# Patient Record
Sex: Male | Born: 1935 | Race: Black or African American | Hispanic: No | Marital: Single | Smoking: Former smoker
Health system: Southern US, Community
[De-identification: ages and names within clinical notes are randomized; demographics above are authoritative.]

## PROBLEM LIST (undated history)

## (undated) DIAGNOSIS — D649 Anemia, unspecified: Secondary | ICD-10-CM

## (undated) DIAGNOSIS — E669 Obesity, unspecified: Secondary | ICD-10-CM

## (undated) DIAGNOSIS — N183 Chronic kidney disease, stage 3 unspecified: Secondary | ICD-10-CM

## (undated) DIAGNOSIS — E785 Hyperlipidemia, unspecified: Secondary | ICD-10-CM

## (undated) DIAGNOSIS — H548 Legal blindness, as defined in USA: Secondary | ICD-10-CM

## (undated) DIAGNOSIS — R609 Edema, unspecified: Secondary | ICD-10-CM

## (undated) DIAGNOSIS — E1129 Type 2 diabetes mellitus with other diabetic kidney complication: Secondary | ICD-10-CM

## (undated) DIAGNOSIS — I1310 Hypertensive heart and chronic kidney disease without heart failure, with stage 1 through stage 4 chronic kidney disease, or unspecified chronic kidney disease: Secondary | ICD-10-CM

## (undated) DIAGNOSIS — D075 Carcinoma in situ of prostate: Secondary | ICD-10-CM

## (undated) HISTORY — PX: COLONOSCOPY: SHX174

---

## 2001-12-24 ENCOUNTER — Encounter (INDEPENDENT_AMBULATORY_CARE_PROVIDER_SITE_OTHER): Payer: Self-pay | Admitting: Specialist

## 2001-12-24 ENCOUNTER — Observation Stay (HOSPITAL_COMMUNITY): Admission: AD | Admit: 2001-12-24 | Discharge: 2001-12-25 | Payer: Self-pay | Admitting: Gastroenterology

## 2002-03-05 ENCOUNTER — Inpatient Hospital Stay (HOSPITAL_COMMUNITY): Admission: EM | Admit: 2002-03-05 | Discharge: 2002-03-07 | Payer: Self-pay | Admitting: Emergency Medicine

## 2007-05-14 ENCOUNTER — Emergency Department (HOSPITAL_COMMUNITY): Admission: EM | Admit: 2007-05-14 | Discharge: 2007-05-14 | Payer: Self-pay | Admitting: *Deleted

## 2007-06-14 ENCOUNTER — Emergency Department (HOSPITAL_COMMUNITY): Admission: EM | Admit: 2007-06-14 | Discharge: 2007-06-14 | Payer: Self-pay | Admitting: Emergency Medicine

## 2007-06-16 ENCOUNTER — Emergency Department (HOSPITAL_COMMUNITY): Admission: EM | Admit: 2007-06-16 | Discharge: 2007-06-16 | Payer: Self-pay | Admitting: Emergency Medicine

## 2008-05-28 ENCOUNTER — Emergency Department (HOSPITAL_COMMUNITY): Admission: EM | Admit: 2008-05-28 | Discharge: 2008-05-28 | Payer: Self-pay | Admitting: Emergency Medicine

## 2008-10-10 ENCOUNTER — Emergency Department (HOSPITAL_COMMUNITY): Admission: EM | Admit: 2008-10-10 | Discharge: 2008-10-10 | Payer: Self-pay | Admitting: Emergency Medicine

## 2010-01-09 ENCOUNTER — Emergency Department (HOSPITAL_COMMUNITY)
Admission: EM | Admit: 2010-01-09 | Discharge: 2010-01-09 | Payer: Self-pay | Source: Home / Self Care | Admitting: Emergency Medicine

## 2010-09-28 ENCOUNTER — Ambulatory Visit (HOSPITAL_COMMUNITY)
Admission: RE | Admit: 2010-09-28 | Discharge: 2010-09-28 | Payer: Self-pay | Source: Home / Self Care | Attending: Urology | Admitting: Urology

## 2011-01-07 LAB — DIFFERENTIAL
Basophils Absolute: 0.1 10*3/uL (ref 0.0–0.1)
Basophils Relative: 2 % — ABNORMAL HIGH (ref 0–1)
Eosinophils Absolute: 0.6 10*3/uL (ref 0.0–0.7)
Lymphocytes Relative: 16 % (ref 12–46)
Lymphs Abs: 0.9 10*3/uL (ref 0.7–4.0)
Monocytes Absolute: 0.5 10*3/uL (ref 0.1–1.0)
Neutro Abs: 3.3 10*3/uL (ref 1.7–7.7)
Neutrophils Relative %: 61 % (ref 43–77)

## 2011-01-07 LAB — BASIC METABOLIC PANEL
BUN: 36 mg/dL — ABNORMAL HIGH (ref 6–23)
CO2: 25 mEq/L (ref 19–32)
Chloride: 106 mEq/L (ref 96–112)
Creatinine, Ser: 2.34 mg/dL — ABNORMAL HIGH (ref 0.4–1.5)

## 2011-01-07 LAB — CBC
HCT: 31.2 % — ABNORMAL LOW (ref 39.0–52.0)
Hemoglobin: 10.6 g/dL — ABNORMAL LOW (ref 13.0–17.0)
Platelets: 161 10*3/uL (ref 150–400)
RBC: 3.73 MIL/uL — ABNORMAL LOW (ref 4.22–5.81)

## 2011-01-07 LAB — URINE MICROSCOPIC-ADD ON

## 2011-01-07 LAB — URINALYSIS, ROUTINE W REFLEX MICROSCOPIC
Hgb urine dipstick: NEGATIVE
Protein, ur: NEGATIVE mg/dL

## 2011-01-07 LAB — GLUCOSE, CAPILLARY

## 2011-01-16 ENCOUNTER — Emergency Department (HOSPITAL_BASED_OUTPATIENT_CLINIC_OR_DEPARTMENT_OTHER)
Admission: EM | Admit: 2011-01-16 | Discharge: 2011-01-16 | Disposition: A | Discharge status: Home / Self Care | Payer: Medicare Other | Attending: Emergency Medicine | Admitting: Emergency Medicine

## 2011-01-16 DIAGNOSIS — E162 Hypoglycemia, unspecified: Secondary | ICD-10-CM | POA: Insufficient documentation

## 2011-01-16 DIAGNOSIS — E785 Hyperlipidemia, unspecified: Secondary | ICD-10-CM | POA: Insufficient documentation

## 2011-01-16 DIAGNOSIS — Z79899 Other long term (current) drug therapy: Secondary | ICD-10-CM | POA: Insufficient documentation

## 2011-01-16 DIAGNOSIS — N390 Urinary tract infection, site not specified: Secondary | ICD-10-CM | POA: Insufficient documentation

## 2011-01-16 DIAGNOSIS — I10 Essential (primary) hypertension: Secondary | ICD-10-CM | POA: Insufficient documentation

## 2011-01-16 LAB — URINALYSIS, ROUTINE W REFLEX MICROSCOPIC
Glucose, UA: NEGATIVE mg/dL
Ketones, ur: NEGATIVE mg/dL
Nitrite: NEGATIVE
Protein, ur: NEGATIVE mg/dL
Specific Gravity, Urine: 1.017 (ref 1.005–1.030)
pH: 5 (ref 5.0–8.0)

## 2011-01-16 LAB — URINE MICROSCOPIC-ADD ON

## 2011-01-16 LAB — GLUCOSE, CAPILLARY: Glucose-Capillary: 124 mg/dL — ABNORMAL HIGH (ref 70–99)

## 2011-01-17 LAB — URINE CULTURE
Colony Count: >=100000
Culture  Setup Time: 201204030556

## 2011-03-02 NOTE — Procedures (Signed)
[facility]  Patient:    Luke Johnson, Luke Johnson Visit Number: [id number] MRN: [id number]          Service Type: MED Location: 3W [id number] 01 Attending Physician:  Nelda Marseille Dictated by:   Petra Kuba, M.D. Proc. Date: 12/25/01 Admit Date:  12/24/2001 Discharge Date: 12/25/2001   CC:         Reather Littler, M.D.   Procedure Report  PROCEDURE:  Colonoscopy with polypectomy.  INDICATIONS:  Patient with guaiac-positivity and anemia in a patient due for colonic screening.  Benefits methods and options thoroughly discussed in the office.  MEDICATIONS:  Demerol 40, Versed 4.  DESCRIPTION OF PROCEDURE:  Rectal inspection is pertinent for external hemorrhoids, small.  Digital exam was negative.  The video pediatric adjustable colonoscope was inserted and fairly easily advanced around the colon to the cecum.  This did require some abdominal pressure and rolling him on his back.  On insertion, no significant abnormality was seen.  The cecum was identified by the appendiceal orifice and the ileocecal valve.  In fact, the scope was inserted a short ways into the terminal ileum which was normal. Photodocumentation was obtained.  The scope was slowly withdrawn.  The prep was adequate.  There was some liquid stool that required washing and suctioning.  On slow withdrawal through the colon in the proximal ascending, a 2 mm polyp was seen and was cold biopsied x 1.  In the hepatic flexure a 3-4 polyp was seen, snared, electrocautery applied, and the polyp was suctioned through the scope and collected in the trap. That was put in with the other ascending polyp mentioned above.  The scope was slowly withdrawn.  In the mid sigmoid, a 2 mm polyp was seen and was hot biopsied x 2, and a second questionable tiny polyp in the distal sigmoid was seen and cold biopsied x 1 and put in the second container.  Once back in the rectum, some very mild distal radiation proctitis  was seen.  The scope was retroflexed revealing some tiny internal hemorrhoids.  The scope was straightened, air was suctioned, and the scope removed.  The patient tolerated the procedure well.  There was no obvious immediate complication.  ENDOSCOPIC DIAGNOSES: 1. Internal and external hemorrhoids. 2. Minimal distal radiation proctitis. 3. Tiny sigmoid questionable polyp, cold biopsied. 4. Small mid sigmoid polyp, hot biopsied x 2. 5. Small hepatic flexure polyp snared. 6. Tiny ascending polyp cold biopsied. 7. Otherwise within normal limits to the terminal ileum.  PLAN:  Await pathology to determine future colonic screening.  Happy to use the argon Erbe plasma coagulator p.r.n. increased bleeding.  In the meantime, we will try Canasa suppositories and see back in the office in 203 months to recheck symptoms, guaiacs, CBC, and make no further work-up plans are needed. Dictated by:   Petra Kuba, M.D. Attending Physician:  Nelda Marseille DD:  12/25/01 TD:  12/27/01 Job: [id number]

## 2011-03-02 NOTE — H&P (Signed)
[place]. [facility]  Patient:    Luke Johnson, Luke Johnson Visit Number: [id number] MRN: [id number]          Service Type: MED Location: MICU 2101 01 Attending Physician:  Altheimer, [place] Dictated by:   Veverly Fells. Altheimer, M.D. Admit Date:  03/05/2002   CC:         Reather Littler, M.D.  [facility]   History and Physical  REASON FOR ADMISSION:  Severe refractory hypoglycemia.  HISTORY:  This is a 77 year old black male with type 2 diabetes diagnosed in about 77.  He previously was on insulin but that was discontinued in 1998 due to hypoglycemia.  He has been in fair control on Glucovance 2.5/500 two tablets b.i.d.  He is a resident of [facility].  He denies any prior severe hypoglycemic episodes on Glucovance but it is unclear how accurate his history may be at the present time.  Hemoglobin A1c was 7.1% earlier this month.  He reports some nausea and vomiting, perhaps yesterday, which has resolved. He states that he was eating normally today but again, the history is unclear. Today he was noted at the nursing home to have altered mental status and was found to have severe hypoglycemia and was brought to the emergency room by EMS.  He was combative and confused and disoriented on arrival in the emergency room with a blood sugar of 31.  He was also diaphoretic.  He was treated with one amp of D-50 and subsequently was able to eat some, and his symptoms resolved and his CBG went up as high as 159.  However, his CBG fell back down to about 44 with much milder symptoms about four hours into his observation in the emergency room.  He was given two additional amps of D-50 and his CBG is currently 156.  He is alert and oriented and denies current complaints.  He has eaten additional small amounts in the emergency room.  PAST MEDICAL HISTORY: 1. Hypertension. 2. Dislipidemia. 3. 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 4. Prostate cancer  in 1995 for which he underwent a seed implant. 5. Right ureteral lithotomy in 1972.  ALLERGIES:  No known drug allergies.  MEDICATIONS: 1. Glucovance as above. 2. Lotrel 5/20 q.d. 3. Lozol 2.5 mg q.d. 4. Pravachol 40 mg q.d.  FAMILY HISTORY:  Positive for diabetes.  Otherwise noncontributory.  SOCIAL HISTORY:  He is a resident of [facility] [place].  He is a former Visual merchandiser.  He does not smoke or consume alcohol.  REVIEW OF SYSTEMS:  Denies headaches.  Severe visual loss as above.  Poor dentition.  CARDIOPULMONARY:  Denies dyspnea, wheezing, cough, chest pain, or palpitations.  GASTROINTESTINAL:  Episode of nausea and vomiting as above. Otherwise negative.  Denies abdominal pain, heartburn, stool changes. GENITOURINARY:  Denies dysuria or voiding symptoms.  MUSCULOSKELETAL: Negative.  NEUROLOGIC:  Denies weakness, numbness, or gait disorder.  PHYSICAL EXAMINATION:  GENERAL:  He is currently alert, pleasant, cooperative, and oriented x3 except that he needed some prompting to come up with the year.  VITAL SIGNS:  Stable and oxygen saturation 99%.  SKIN:  Negative.  HEENT:  Eyes:  He is nearly totally blind in both eyes.  Corneal opacities. Oropharynx:  Poor dentition.  NECK:  Supple without thyromegaly, with carotid upstrokes normal without bruit.  LUNGS:  Unlabored, clear.  HEART:  Regular without murmur.  ABDOMEN:  Soft, nontender, no mass.  EXTREMITIES:  Pedal pulses intact without edema.  NEUROLOGIC:  Intact without any focal deficit.  LABORATORY DATA:  No other laboratory data is yet available.  ASSESSMENT: 1. Severe hypoglycemia secondary to Glucovance.  He is at risk for recurrent    hypoglycemia due to the long duration of effect of glyburide. 2. Diabetes mellitus type 2. 3. Blindness both eyes secondary to glaucoma. 4. Dislipidemia. 5. Hypertension.  PLAN:  Will treat with D-5-half normal saline and encourage him to eat. Hourly CBGs for now.  Will stop  the Glucovance and probably change him to plain Glucophage plus low-dose Glucotrol XL to reduce the risk of recurrent episodes in the future. Dictated by:   Veverly Fells. Altheimer, M.D. Attending Physician:  Brennan Bailey DD:  03/05/02 TD:  03/07/02 Job: [id number]

## 2011-03-02 NOTE — Discharge Summary (Signed)
[place]. [facility]  Patient:    Luke Johnson, Luke Johnson Visit Number: [id number] MRN: [id number]          Service Type: MED Location: 5000 [id number] Attending Physician:  Altheimer, [place] Dictated by:   Corrin Parker, M.D. Admit Date:  03/05/2002 Disc. Date: 03/07/02                             Discharge Summary  HISTORY OF PRESENT ILLNESS: The patient is an elderly man who presented to the hospital with a history of hypoglycemia. He has a history of type II diabetes which has been long standing and he has been receiving Glucovance for this. He has a history of hypertension for which he has been on several medications with excellent control and also a history of dyslipidemia for which he has been taking Pravachol. He has a history of prostate cancer which was treated with radiotherapy in the past and he has a history of being blind from glaucoma. He has moderate mild peripheral neuropathy and a history of previous bradycardia. He has a mild hypochromic anemia for which he has taken iron in the past.  HOSPITAL COURSE: He presented to the ER with combative behavior associated with severe hypoglycemia. He was treated with glucose and now is essentially back to his baseline. His glucoses have increased recently. He has been discharged to be further followed up in the nursing home.  DISCHARGE IMPRESSION: 1. Hypoglycemia, secondary to Glucovance. 2. History of hypertension. 3. Dyslipidemia. 4. History of prostate cancer. 5. History of anemia.  DISCHARGE MEDICATIONS: 1. Norvasc 5 mg q.d. 2. Lotensin 20 mg q.d. 3. Lozol 2.5 mg q.d. 4. Pravachol 40 mg each p.m. 5. Starlix 120 mg at mealtime. 6. Actos 30 mg one q.d.  SPECIAL INSTRUCTIONS: His glucoses are to be checked b.i.d. before breakfast and before supper.  DIET: Diabetic diet.  ACTIVITY: As tolerated.  FOLLOW-UP: He should be seen by Dr. ___ in a period of two to three weeks.  DISCHARGE  CONDITION: Improved. Dictated by:   Corrin Parker, M.D. Attending Physician:  Brennan Bailey DD:  03/07/02 TD:  03/07/02 Job: [id number]

## 2011-07-13 LAB — POCT I-STAT, CHEM 8
BUN: 35 — ABNORMAL HIGH
Creatinine, Ser: 1.9 — ABNORMAL HIGH
HCT: 31 — ABNORMAL LOW
Potassium: 3.8
Sodium: 138
TCO2: 23

## 2011-07-13 LAB — URINE MICROSCOPIC-ADD ON

## 2011-07-13 LAB — DIFFERENTIAL
Basophils Absolute: 0
Lymphs Abs: 1.1
Monocytes Relative: 14 — ABNORMAL HIGH

## 2011-07-13 LAB — POCT CARDIAC MARKERS
CKMB, poc: 2.1
Troponin i, poc: <0.05

## 2011-07-13 LAB — URINALYSIS, ROUTINE W REFLEX MICROSCOPIC
Bilirubin Urine: NEGATIVE
Hgb urine dipstick: NEGATIVE
Urobilinogen, UA: 1

## 2011-07-13 LAB — CBC
HCT: 30.9 — ABNORMAL LOW
Hemoglobin: 10.2 — ABNORMAL LOW
MCHC: 33.1
MCV: 81.4
RBC: 3.79 — ABNORMAL LOW
RDW: 13.6
WBC: 4.1

## 2011-07-13 LAB — URINE CULTURE: Colony Count: >=100000

## 2011-07-20 LAB — URINALYSIS, ROUTINE W REFLEX MICROSCOPIC
Hgb urine dipstick: NEGATIVE
Nitrite: NEGATIVE
Specific Gravity, Urine: 1.007 (ref 1.005–1.030)
Urobilinogen, UA: 1 mg/dL (ref 0.0–1.0)

## 2011-07-20 LAB — POCT I-STAT, CHEM 8
Calcium, Ion: 1.14 mmol/L (ref 1.12–1.32)
Chloride: 96 mEq/L (ref 96–112)
HCT: 34 % — ABNORMAL LOW (ref 39.0–52.0)
Hemoglobin: 11.6 g/dL — ABNORMAL LOW (ref 13.0–17.0)
TCO2: 25 mmol/L (ref 0–100)

## 2011-07-20 LAB — URINE MICROSCOPIC-ADD ON

## 2011-07-20 LAB — GLUCOSE, CAPILLARY
Glucose-Capillary: 60 mg/dL — ABNORMAL LOW (ref 70–99)
Glucose-Capillary: 61 mg/dL — ABNORMAL LOW (ref 70–99)

## 2011-07-27 LAB — I-STAT 8, (EC8 V) (CONVERTED LAB)
Bicarbonate: 26.3 — ABNORMAL HIGH
Glucose, Bld: 118 — ABNORMAL HIGH
HCT: 38 — ABNORMAL LOW
Hemoglobin: 12.9 — ABNORMAL LOW
Operator id: 284141
Sodium: 131 — ABNORMAL LOW
TCO2: 28
pCO2, Ven: 44.9 — ABNORMAL LOW

## 2011-07-27 LAB — URINALYSIS, ROUTINE W REFLEX MICROSCOPIC
Glucose, UA: NEGATIVE
Specific Gravity, Urine: 1.011
pH: 6

## 2011-07-27 LAB — CBC
Hemoglobin: 11.3 — ABNORMAL LOW
MCHC: 33.9
MCV: 80.1
RDW: 13.6

## 2011-07-27 LAB — DIFFERENTIAL
Basophils Absolute: 0
Basophils Relative: 0
Eosinophils Absolute: 0.1
Monocytes Absolute: 0.5
Neutro Abs: 3.9

## 2011-07-27 LAB — POCT I-STAT CREATININE: Operator id: 284141

## 2011-07-30 LAB — I-STAT 8, (EC8 V) (CONVERTED LAB)
Acid-Base Excess: 2
Bicarbonate: 28.3 — ABNORMAL HIGH
HCT: 40
Operator id: 279831
TCO2: 30
pCO2, Ven: 52.4 — ABNORMAL HIGH
pH, Ven: 7.341 — ABNORMAL HIGH

## 2011-07-30 LAB — URINE CULTURE: Colony Count: >=100000

## 2011-07-30 LAB — URINALYSIS, ROUTINE W REFLEX MICROSCOPIC
Bilirubin Urine: NEGATIVE
Hgb urine dipstick: NEGATIVE
Ketones, ur: NEGATIVE
Nitrite: NEGATIVE
Protein, ur: NEGATIVE
Specific Gravity, Urine: 1.011
Urobilinogen, UA: 0.2

## 2011-07-30 LAB — URINE MICROSCOPIC-ADD ON

## 2012-03-19 ENCOUNTER — Other Ambulatory Visit: Payer: Self-pay | Admitting: Gastroenterology

## 2012-06-08 ENCOUNTER — Emergency Department (HOSPITAL_COMMUNITY)
Admission: EM | Admit: 2012-06-08 | Discharge: 2012-06-09 | Disposition: A | Discharge status: Skilled Nursing Facility | Payer: Medicare Other | Attending: Emergency Medicine | Admitting: Emergency Medicine

## 2012-06-08 ENCOUNTER — Emergency Department (HOSPITAL_COMMUNITY): Payer: Medicare Other

## 2012-06-08 ENCOUNTER — Encounter (HOSPITAL_COMMUNITY): Payer: Self-pay | Admitting: Emergency Medicine

## 2012-06-08 DIAGNOSIS — E669 Obesity, unspecified: Secondary | ICD-10-CM | POA: Insufficient documentation

## 2012-06-08 DIAGNOSIS — Z794 Long term (current) use of insulin: Secondary | ICD-10-CM | POA: Insufficient documentation

## 2012-06-08 DIAGNOSIS — R142 Eructation: Secondary | ICD-10-CM | POA: Insufficient documentation

## 2012-06-08 DIAGNOSIS — Z7982 Long term (current) use of aspirin: Secondary | ICD-10-CM | POA: Insufficient documentation

## 2012-06-08 DIAGNOSIS — R140 Abdominal distension (gaseous): Secondary | ICD-10-CM

## 2012-06-08 DIAGNOSIS — N183 Chronic kidney disease, stage 3 unspecified: Secondary | ICD-10-CM | POA: Insufficient documentation

## 2012-06-08 DIAGNOSIS — Z79899 Other long term (current) drug therapy: Secondary | ICD-10-CM | POA: Insufficient documentation

## 2012-06-08 DIAGNOSIS — R143 Flatulence: Secondary | ICD-10-CM | POA: Insufficient documentation

## 2012-06-08 DIAGNOSIS — E119 Type 2 diabetes mellitus without complications: Secondary | ICD-10-CM | POA: Insufficient documentation

## 2012-06-08 DIAGNOSIS — R141 Gas pain: Secondary | ICD-10-CM | POA: Insufficient documentation

## 2012-06-08 HISTORY — DX: Legal blindness, as defined in USA: H54.8

## 2012-06-08 HISTORY — DX: Hyperlipidemia, unspecified: E78.5

## 2012-06-08 HISTORY — DX: Chronic kidney disease, stage 3 (moderate): N18.3

## 2012-06-08 HISTORY — DX: Chronic kidney disease, stage 3 unspecified: N18.30

## 2012-06-08 HISTORY — DX: Type 2 diabetes mellitus with other diabetic kidney complication: E11.29

## 2012-06-08 HISTORY — DX: Obesity, unspecified: E66.9

## 2012-06-08 HISTORY — DX: Anemia, unspecified: D64.9

## 2012-06-08 HISTORY — DX: Carcinoma in situ of prostate: D07.5

## 2012-06-08 HISTORY — DX: Hypertensive heart and chronic kidney disease without heart failure, with stage 1 through stage 4 chronic kidney disease, or unspecified chronic kidney disease: I13.10

## 2012-06-08 HISTORY — DX: Edema, unspecified: R60.9

## 2012-06-08 LAB — URINALYSIS, ROUTINE W REFLEX MICROSCOPIC
Bilirubin Urine: NEGATIVE
Glucose, UA: NEGATIVE mg/dL
Ketones, ur: NEGATIVE mg/dL
Protein, ur: NEGATIVE mg/dL
pH: 6 (ref 5.0–8.0)

## 2012-06-08 NOTE — ED Notes (Signed)
As per EMS pt was given laxative  Today, pt also has a BM today. Still c/o difficulty moving bowels.VSS.pwd

## 2012-06-08 NOTE — ED Notes (Signed)
Pt presents with no acute distress.  Constipated x 1 week- denies abdominal pain N/V/and fever/ Has small bm today yet still feels full

## 2012-06-17 NOTE — ED Provider Notes (Signed)
History     CSN: [id number]  Arrival date & time 06/08/12  2001   First MD Initiated Contact with Patient 06/08/12 2103      Chief Complaint  Patient presents with  . Constipation    (Consider location/radiation/quality/duration/timing/severity/associated sxs/prior treatment) HPI Luke Johnson is a 78 y.o. male presenting from ECF with a chief complaint of bloating and constipation. He had one BM today but was small.  Says his prior BM was a week ago.  Denies abdominal pain, nausea or vomiting.  Denies chest pain, dizziness, shortness of breath.  No other complaints.  His symptoms have been persistent for a week, gradually worsening bloating and are considered moderate.  Past Medical History  Diagnosis Date  . 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 Chronic kidney disease, stage III (moderate)   . Legal blindness, as defined in [place]   . Carcinoma in situ of prostate   . Anemia, unspecified   . Type II or unspecified type diabetes mellitus with renal manifestations, not stated as uncontrolled   . Other and unspecified hyperlipidemia   . Edema   . Obesity, unspecified     History reviewed. No pertinent past surgical history.  No family history on file.  History  Substance Use Topics  . Smoking status: Former Games developer  . Smokeless tobacco: Not on file  . Alcohol Use: No      Review of SystemsPatient denies any fevers or chills, changes in vision, earache, sore throat, neck pain or stiffness, chest pain or pressure, palpitations, syncope, dyspnea, cough, wheezing, abdominal pain, nausea, vomiting, diarrhea, melena, red bloody stools, frequency, dysuria, myalgias, arthralgias, back pain, recent trauma, rash, itching, skin lesions, easy bruising or bleeding, headache, seizures, numbness, tingling or weakness.   Allergies  Review of patient's allergies indicates no known  allergies.  Home Medications   Current Outpatient Rx  Name Route Sig Dispense Refill  . ASPIRIN EC 81 MG PO TBEC Oral Take 81 mg by mouth every other day.     Marland Kitchen BICALUTAMIDE 50 MG PO TABS Oral Take 50 mg by mouth daily.    Marland Kitchen DILTIAZEM HCL ER COATED BEADS 120 MG PO CP24 Oral Take 120 mg by mouth daily.    Marland Kitchen FERROUS FUMARATE ER 50 MG PO TBCR Oral Take 200 mg by mouth daily with breakfast. With food    . FUROSEMIDE 20 MG PO TABS Oral Take 20 mg by mouth 2 (two) times daily.    Marland Kitchen HYDRALAZINE HCL 25 MG PO TABS Oral Take 25 mg by mouth 2 (two) times daily.    . INDAPAMIDE 1.25 MG PO TABS Oral Take 1.25 mg by mouth every morning.    . INSULIN ISOPHANE & REGULAR (70-30) 100 UNIT/ML [place] SUSP Subcutaneous Inject 15 Units into the skin daily after breakfast.    . ADULT MULTIVITAMIN W/MINERALS CH Oral Take 1 tablet by mouth daily.    . SENNA-DOCUSATE SODIUM 8.6-50 MG PO TABS Oral Take 1 tablet by mouth daily.    Marland Kitchen SIMVASTATIN 20 MG PO TABS Oral Take 20 mg by mouth at bedtime.    . EUCERIN EX CREA Topical Apply 1 application topically 2 (two) times daily. Apply to affected areas as directed.      BP 123/61  Pulse 62  Temp 98.7 F (37.1 C) (Oral)  Resp 17  SpO2 100%  Physical Exam VITAL SIGNS:   [facility]  Vitals:   06/09/12 0015  BP:   Pulse:   Temp: 98.7 F (37.1 C)  Resp:    CONSTITUTIONAL: Awake, oriented, appears non-toxic HENT: Atraumatic, normocephalic, oral mucosa pink and moist, airway patent. Nares patent without drainage. External ears normal. EYES: Conjunctiva clear, EOMI, PERRLA NECK: Trachea midline, non-tender, supple CARDIOVASCULAR: Normal heart rate, Normal rhythm, No murmurs, rubs, gallops PULMONARY/CHEST: Clear to auscultation, no rhonchi, wheezes, or rales. Symmetrical breath sounds. Non-tender. ABDOMINAL: Non-distended, soft, non-tender - no rebound or guarding.  BS normal. NEUROLOGIC: Non-focal, moving all four extremities, no gross sensory or motor  deficits. EXTREMITIES: No clubbing, cyanosis, or edema SKIN: Warm, Dry, No erythema, No rash  ED Course  Procedures (including critical care time  Labs Reviewed  URINALYSIS, ROUTINE W REFLEX MICROSCOPIC  LAB REPORT - SCANNED   No results found.   1. Abdominal bloating       MDM  Luke Johnson is a 78 y.o. male presenting with a benign, soft abdomen with concerns for bloating.  UA and XR of abdomen are WNL with a non-obstructive bowel gas pattern.  Reassured pt, doubt with his non-toxic and well appearing presentation that he has any life-threatening abdominal process.  Will DC stable and in good condition.  Encouraged use of stool softeners and bulk-forming or Glycolax as needed.        Jones Skene, MD 06/17/12 1157

## 2012-06-24 ENCOUNTER — Emergency Department (HOSPITAL_COMMUNITY)
Admission: EM | Admit: 2012-06-24 | Discharge: 2012-06-25 | Disposition: A | Discharge status: Other Acute Inpatient Hospital | Payer: Medicare Other | Attending: Emergency Medicine | Admitting: Emergency Medicine

## 2012-06-24 DIAGNOSIS — Z79899 Other long term (current) drug therapy: Secondary | ICD-10-CM | POA: Insufficient documentation

## 2012-06-24 DIAGNOSIS — Z7982 Long term (current) use of aspirin: Secondary | ICD-10-CM | POA: Insufficient documentation

## 2012-06-24 DIAGNOSIS — Z87891 Personal history of nicotine dependence: Secondary | ICD-10-CM | POA: Insufficient documentation

## 2012-06-24 DIAGNOSIS — N183 Chronic kidney disease, stage 3 unspecified: Secondary | ICD-10-CM | POA: Insufficient documentation

## 2012-06-24 DIAGNOSIS — E785 Hyperlipidemia, unspecified: Secondary | ICD-10-CM | POA: Insufficient documentation

## 2012-06-24 DIAGNOSIS — R51 Headache: Secondary | ICD-10-CM | POA: Insufficient documentation

## 2012-06-24 DIAGNOSIS — E669 Obesity, unspecified: Secondary | ICD-10-CM | POA: Insufficient documentation

## 2012-06-24 DIAGNOSIS — E119 Type 2 diabetes mellitus without complications: Secondary | ICD-10-CM | POA: Insufficient documentation

## 2012-06-24 NOTE — ED Notes (Signed)
Transported via EMS from NH for evaluation of headache. States headache started on yesterday without any other sx. States head is hurting "a little bit" at present. Denies any other pain or SOB. Asking for something to help him rest. Pt alert oriented and answering questions appropriately. MAEx4.

## 2012-06-25 ENCOUNTER — Emergency Department (HOSPITAL_COMMUNITY): Payer: Medicare Other

## 2012-06-25 ENCOUNTER — Encounter (HOSPITAL_COMMUNITY): Payer: Self-pay | Admitting: *Deleted

## 2012-06-25 NOTE — ED Notes (Signed)
[facility] called for transport back to NH 

## 2012-06-26 NOTE — ED Provider Notes (Signed)
History     CSN: [id number]  Arrival date & time 06/24/12  2310   First MD Initiated Contact with Patient 06/24/12 2325      Chief Complaint  Patient presents with  . Headache     HPI Transported via EMS from NH for evaluation of headache. States headache started on yesterday without any other sx. States head is hurting "a little bit" at present. Denies any other pain or SOB. Asking for something to help him rest. Pt alert oriented and answering questions appropriately.  Similar complaints in past  Past Medical History  Diagnosis Date  . 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 Chronic kidney disease, stage III (moderate)   . Legal blindness, as defined in [place]   . Carcinoma in situ of prostate   . Anemia, unspecified   . Type II or unspecified type diabetes mellitus with renal manifestations, not stated as uncontrolled   . Other and unspecified hyperlipidemia   . Edema   . Obesity, unspecified   . Diabetes mellitus     History reviewed. No pertinent past surgical history.  History reviewed. No pertinent family history.  History  Substance Use Topics  . Smoking status: Former Games developer  . Smokeless tobacco: Not on file  . Alcohol Use: No      Review of Systems  All other systems reviewed and are negative.    Allergies  Review of patient's allergies indicates no known allergies.  Home Medications   Current Outpatient Rx  Name Route Sig Dispense Refill  . ASPIRIN EC 81 MG PO TBEC Oral Take 81 mg by mouth every other day.     Marland Kitchen BICALUTAMIDE 50 MG PO TABS Oral Take 50 mg by mouth daily.    Marland Kitchen DILTIAZEM HCL ER COATED BEADS 120 MG PO CP24 Oral Take 120 mg by mouth daily.    Marland Kitchen FERROUS FUMARATE ER 50 MG PO TBCR Oral Take 200 mg by mouth daily with breakfast. With food    . FUROSEMIDE 20 MG PO TABS Oral Take 20 mg by mouth daily.     . INDAPAMIDE 1.25 MG PO TABS Oral  Take 1.25 mg by mouth every morning.    . INSULIN ISOPHANE & REGULAR (70-30) 100 UNIT/ML [place] SUSP Subcutaneous Inject 15 Units into the skin daily with breakfast.    . ADULT MULTIVITAMIN W/MINERALS CH Oral Take 1 tablet by mouth daily.    . SENNA-DOCUSATE SODIUM 8.6-50 MG PO TABS Oral Take 1 tablet by mouth daily.    Marland Kitchen SIMVASTATIN 20 MG PO TABS Oral Take 20 mg by mouth at bedtime.    . EUCERIN EX CREA Topical Apply 1 application topically 2 (two) times daily. Apply to affected areas as directed.      BP 142/52  Pulse 58  Temp 98.3 F (36.8 C) (Oral)  Resp 16  SpO2 100%  Physical Exam  Nursing note and vitals reviewed. Constitutional: He is oriented to person, place, and time. He appears well-developed. No distress.  HENT:  Head: Normocephalic and atraumatic.  Eyes: Right pupil is not round and not reactive. Left pupil is not round and not reactive.  Neck: Normal range of motion.  Cardiovascular: Normal rate and intact distal pulses.   Pulmonary/Chest: No respiratory distress.  Abdominal: Normal appearance. He exhibits no distension.  Musculoskeletal: Normal range of motion.  Neurological: He is alert and oriented to person, place,  and time. He has normal strength. No cranial nerve deficit.       No focal findings                   Skin: Skin is warm and dry. No rash noted.  Psychiatric: He has a normal mood and affect. His behavior is normal.    ED Course  Procedures (including critical care time)  Labs Reviewed - No data to display Ct Head Wo Contrast  06/25/2012  *RADIOLOGY REPORT*  Clinical Data: Headaches.  Diffuse weakness.  CT HEAD WITHOUT CONTRAST  Technique:  Contiguous axial images were obtained from the base of the skull through the vertex without contrast.  Comparison: 01/09/2010.  Findings: Stable enlarged ventricles and subarachnoid spaces.  Mild patchy white matter low density in both cerebral hemispheres.  No intracranial hemorrhage, mass lesion or CT evidence of  acute infarction.  Unremarkable bones and included paranasal sinuses.  IMPRESSION:  1.  No acute abnormality. 2.  Stable atrophy and mild chronic small vessel white matter ischemic changes.   Original Report Authenticated By: Darrol Angel, M.D.      1. Headache       MDM          Nelia Shi, MD 06/26/12 [phone]

## 2012-06-27 ENCOUNTER — Emergency Department (HOSPITAL_COMMUNITY)
Admission: EM | Admit: 2012-06-27 | Discharge: 2012-06-27 | Disposition: A | Discharge status: Skilled Nursing Facility | Payer: Medicare Other | Attending: Emergency Medicine | Admitting: Emergency Medicine

## 2012-06-27 ENCOUNTER — Encounter (HOSPITAL_COMMUNITY): Payer: Self-pay | Admitting: *Deleted

## 2012-06-27 ENCOUNTER — Emergency Department (HOSPITAL_COMMUNITY): Payer: Medicare Other

## 2012-06-27 DIAGNOSIS — H269 Unspecified cataract: Secondary | ICD-10-CM | POA: Insufficient documentation

## 2012-06-27 DIAGNOSIS — N189 Chronic kidney disease, unspecified: Secondary | ICD-10-CM | POA: Insufficient documentation

## 2012-06-27 DIAGNOSIS — R51 Headache: Secondary | ICD-10-CM | POA: Insufficient documentation

## 2012-06-27 DIAGNOSIS — E1129 Type 2 diabetes mellitus with other diabetic kidney complication: Secondary | ICD-10-CM | POA: Insufficient documentation

## 2012-06-27 DIAGNOSIS — H548 Legal blindness, as defined in USA: Secondary | ICD-10-CM | POA: Insufficient documentation

## 2012-06-27 DIAGNOSIS — I129 Hypertensive chronic kidney disease with stage 1 through stage 4 chronic kidney disease, or unspecified chronic kidney disease: Secondary | ICD-10-CM | POA: Insufficient documentation

## 2012-06-27 LAB — POCT I-STAT, CHEM 8
BUN: 42 mg/dL — ABNORMAL HIGH (ref 6–23)
Calcium, Ion: 1.05 mmol/L — ABNORMAL LOW (ref 1.13–1.30)
Chloride: 102 mEq/L (ref 96–112)
Creatinine, Ser: 1.9 mg/dL — ABNORMAL HIGH (ref 0.50–1.35)
Glucose, Bld: 89 mg/dL (ref 70–99)

## 2012-06-27 MED ORDER — ACETAMINOPHEN 325 MG PO TABS
650.0000 mg | ORAL_TABLET | Freq: Once | ORAL | Status: AC
  Administered 2012-06-27: 650 mg via ORAL
  Filled 2012-06-27: qty 2

## 2012-06-27 MED ORDER — ACETAMINOPHEN 500 MG PO TABS: 500.0000 mg | ORAL_TABLET | Freq: Four times a day (QID) | ORAL | Status: AC | PRN

## 2012-06-27 NOTE — ED Provider Notes (Addendum)
History     CSN: [id number]  Arrival date & time 06/27/12  1714   First MD Initiated Contact with Patient 06/27/12 1752      Chief Complaint  Patient presents with  . Headache    (Consider location/radiation/quality/duration/timing/severity/associated sxs/prior treatment) Patient is a 78 y.o. male presenting with headaches. The history is provided by the patient.  Headache  This is a new problem. Episode onset: 3 days ago. Episode frequency: only when he gets up and walks around. The problem has not changed since onset.Associated with: activity. Pain location: global. The quality of the pain is described as dull. The pain is at a severity of 3/10. The pain is mild. The pain does not radiate. Pertinent negatives include no anorexia, no fever, no shortness of breath, no nausea and no vomiting. He has tried nothing for the symptoms. The treatment provided no relief.    Past Medical History  Diagnosis Date  . 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 Chronic kidney disease, stage III (moderate)   . Legal blindness, as defined in [place]   . Carcinoma in situ of prostate   . Anemia, unspecified   . Type II or unspecified type diabetes mellitus with renal manifestations, not stated as uncontrolled   . Other and unspecified hyperlipidemia   . Edema   . Obesity, unspecified   . Diabetes mellitus     History reviewed. No pertinent past surgical history.  No family history on file.  History  Substance Use Topics  . Smoking status: Former Games developer  . Smokeless tobacco: Not on file  . Alcohol Use: No      Review of Systems  Constitutional: Negative for fever.  Eyes:       Cataracts in both eyes with no eye site  Respiratory: Negative for shortness of breath.   Gastrointestinal: Negative for nausea, vomiting and anorexia.  Neurological: Positive for headaches.  All other systems  reviewed and are negative.    Allergies  Review of patient's allergies indicates no known allergies.  Home Medications   Current Outpatient Rx  Name Route Sig Dispense Refill  . ASPIRIN EC 81 MG PO TBEC Oral Take 81 mg by mouth every other day.     Marland Kitchen BICALUTAMIDE 50 MG PO TABS Oral Take 50 mg by mouth daily.    Marland Kitchen DILTIAZEM HCL ER COATED BEADS 120 MG PO CP24 Oral Take 120 mg by mouth daily.    Marland Kitchen FERROUS FUMARATE ER 50 MG PO TBCR Oral Take 200 mg by mouth daily with breakfast. After food    . FUROSEMIDE 20 MG PO TABS Oral Take 20 mg by mouth daily.     Marland Kitchen HYDRALAZINE HCL 50 MG PO TABS Oral Take 50 mg by mouth 2 (two) times daily.    . INDAPAMIDE 1.25 MG PO TABS Oral Take 1.25 mg by mouth every morning.    . ADULT MULTIVITAMIN W/MINERALS CH Oral Take 1 tablet by mouth daily.    . SENNA-DOCUSATE SODIUM 8.6-50 MG PO TABS Oral Take 1 tablet by mouth daily.    Marland Kitchen SIMVASTATIN 20 MG PO TABS Oral Take 20 mg by mouth at bedtime.    . EUCERIN EX CREA Topical Apply 1 application topically 2 (two) times daily. Apply to affected areas as directed.      BP 152/79  Pulse 63  Temp 98.5 F (36.9 C) (Oral)  Resp 12  SpO2 99%  Physical Exam  Nursing note and vitals reviewed. Constitutional: He is oriented to person, place, and time. He appears well-developed and well-nourished. No distress.  HENT:  Head: Normocephalic and atraumatic.  Mouth/Throat: Oropharynx is clear and moist.  Eyes:       Bilateral cataracts  Neck: Normal range of motion. Neck supple.  Cardiovascular: Normal rate, regular rhythm and intact distal pulses.   No murmur heard. Pulmonary/Chest: Effort normal and breath sounds normal. No respiratory distress. He has no wheezes. He has no rales.  Abdominal: Soft. He exhibits no distension. There is no tenderness. There is no rebound and no guarding.  Musculoskeletal: Normal range of motion. He exhibits no edema and no tenderness.  Neurological: He is alert and oriented to person,  place, and time.  Skin: Skin is warm and dry. No rash noted. No erythema.  Psychiatric: He has a normal mood and affect. His behavior is normal.    ED Course  Procedures (including critical care time)  Labs Reviewed  POCT I-STAT, CHEM 8 - Abnormal; Notable for the following:    BUN 42 (*)     Creatinine, Ser 1.90 (*)     Calcium, Ion 1.05 (*)     All other components within normal limits   Ct Head Wo Contrast  06/27/2012  *RADIOLOGY REPORT*  Clinical Data: 78 year old male with severe headache.  CT HEAD WITHOUT CONTRAST  Technique:  Contiguous axial images were obtained from the base of the skull through the vertex without contrast.  Comparison: 06/25/2012.  Findings: Mild generalized cerebral volume loss and mild chronic small vessel white matter ischemic changes again noted.  No acute intracranial abnormalities are identified, including mass lesion or mass effect, hydrocephalus, extra-axial fluid collection, midline shift, hemorrhage, or acute infarction.  The visualized bony calvarium is unremarkable.  IMPRESSION: No evidence of acute intracranial abnormality.  Mild atrophy and chronic small vessel white matter ischemic changes.   Original Report Authenticated By: Rosendo Gros, M.D.      1. Headache       MDM   Pt with nonspecific mild HA for the last few days that is worse with acitivity.  Denies worst HA of life or sx suggestive of SAH.  No focal deficits.  Pt is blind and uses walking stick due to cataracts.  Normal BP and normal BMP. Pt given tylenol which helped and pt d/ced home.        Gwyneth Sprout, MD 06/27/12 1950  Gwyneth Sprout, MD 06/27/12 [id number]

## 2012-06-27 NOTE — ED Notes (Signed)
[facility] called for transport.  

## 2012-06-27 NOTE — ED Notes (Signed)
Pt reports he has a headache on top of his head that is worse with ambulation and movement and is only slightly painful while sitting. Pt reports he has had similar headaches, but this one has lasted longer than typical. Pt denies falling or hitting his head. Pt denies photophobia. Pt has history of blindness. Pt reports taking an aspirin for his pain without relief.

## 2012-06-27 NOTE — ED Notes (Addendum)
Per EMS. Pt is from [facility] in [place]. Pt reports no pain unless ambulating. Pt reports headache on movement x2 days. Pt denies dizziness or lightheadedness. Pt reports pain is across top of head. Facility physician wanted the patient sent out for further eval.

## 2012-06-27 NOTE — ED Notes (Signed)
WUX:[id number]<WN> Expected date:06/27/12<BR> Expected time: 5:04 PM<BR> Means of arrival:<BR> Comments:<BR> 52 male nursing home headache

## 2012-06-28 ENCOUNTER — Emergency Department (HOSPITAL_COMMUNITY)
Admission: EM | Admit: 2012-06-28 | Discharge: 2012-06-28 | Disposition: A | Discharge status: Home / Self Care | Payer: Medicare Other | Attending: Emergency Medicine | Admitting: Emergency Medicine

## 2012-06-28 ENCOUNTER — Emergency Department (HOSPITAL_COMMUNITY): Payer: Medicare Other

## 2012-06-28 DIAGNOSIS — R51 Headache: Secondary | ICD-10-CM | POA: Insufficient documentation

## 2012-06-28 DIAGNOSIS — E876 Hypokalemia: Secondary | ICD-10-CM

## 2012-06-28 DIAGNOSIS — Z8546 Personal history of malignant neoplasm of prostate: Secondary | ICD-10-CM | POA: Insufficient documentation

## 2012-06-28 DIAGNOSIS — D649 Anemia, unspecified: Secondary | ICD-10-CM | POA: Insufficient documentation

## 2012-06-28 DIAGNOSIS — N183 Chronic kidney disease, stage 3 unspecified: Secondary | ICD-10-CM | POA: Insufficient documentation

## 2012-06-28 DIAGNOSIS — Z87891 Personal history of nicotine dependence: Secondary | ICD-10-CM | POA: Insufficient documentation

## 2012-06-28 DIAGNOSIS — H548 Legal blindness, as defined in USA: Secondary | ICD-10-CM | POA: Insufficient documentation

## 2012-06-28 DIAGNOSIS — E669 Obesity, unspecified: Secondary | ICD-10-CM | POA: Insufficient documentation

## 2012-06-28 DIAGNOSIS — E119 Type 2 diabetes mellitus without complications: Secondary | ICD-10-CM | POA: Insufficient documentation

## 2012-06-28 LAB — CBC
HCT: 34.4 % — ABNORMAL LOW (ref 39.0–52.0)
MCH: 26.9 pg (ref 26.0–34.0)
MCHC: 33.1 g/dL (ref 30.0–36.0)
MCV: 81.1 fL (ref 78.0–100.0)
RDW: 13.1 % (ref 11.5–15.5)

## 2012-06-28 LAB — BASIC METABOLIC PANEL
BUN: 37 mg/dL — ABNORMAL HIGH (ref 6–23)
Calcium: 8.9 mg/dL (ref 8.4–10.5)
Chloride: 99 mEq/L (ref 96–112)
Creatinine, Ser: 1.9 mg/dL — ABNORMAL HIGH (ref 0.50–1.35)
GFR calc Af Amer: 36 mL/min — ABNORMAL LOW (ref >90)

## 2012-06-28 MED ORDER — ACETAMINOPHEN 325 MG PO TABS
650.0000 mg | ORAL_TABLET | Freq: Once | ORAL | Status: AC
  Administered 2012-06-28: 650 mg via ORAL
  Filled 2012-06-28: qty 2

## 2012-06-28 MED ORDER — POTASSIUM CHLORIDE CRYS ER 20 MEQ PO TBCR
40.0000 meq | EXTENDED_RELEASE_TABLET | Freq: Once | ORAL | Status: AC
  Administered 2012-06-28: 40 meq via ORAL
  Filled 2012-06-28: qty 2

## 2012-06-28 NOTE — ED Provider Notes (Signed)
History     CSN: [id number]  Arrival date & time 06/28/12  1418   First MD Initiated Contact with Patient 06/28/12 1505      Chief Complaint  Patient presents with  . Headache    (Consider location/radiation/quality/duration/timing/severity/associated sxs/prior treatment) HPI Comments: [name] via EMS from a local nursing home for evaluation of a global headache. He has been seen in an emergency department on 9/10 and 9/13 or the same headache. It has been intermittent and ongoing for 3-4 weeks.  He has previously taken aspirin for it with great improvement in the discomfort.  He denies any acute change in the character or location of the discomfort.    He denies any fevers, earache, sore throat, dizziness or ataxia, nausea, vomiting, neck pain or stiffness, respiratory complaints.  He also denies any new visual changes but admits that he is blind (with the exception of being able to recognize light and shadows) secondary to severe bilateral cataracts.  Patient is a 78 y.o. male presenting with headaches. The history is provided by the patient and medical records. No language interpreter was used.  Headache  This is a recurrent problem. The current episode started more than 1 week ago. The problem has not changed since onset.The headache is associated with nothing. The quality of the pain is described as dull. The pain is at a severity of 3/10. The pain is mild. The pain does not radiate. Pertinent negatives include no anorexia, no fever, no malaise/fatigue, no chest pressure, no near-syncope, no orthopnea, no palpitations, no syncope, no shortness of breath, no nausea and no vomiting. He has tried aspirin and acetaminophen for the symptoms.    Past Medical History  Diagnosis Date  . 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 Chronic kidney disease, stage III (moderate)   .  Legal blindness, as defined in [place]   . Carcinoma in situ of prostate   . Anemia, unspecified   . Type II or unspecified type diabetes mellitus with renal manifestations, not stated as uncontrolled   . Other and unspecified hyperlipidemia   . Edema   . Obesity, unspecified   . Diabetes mellitus     No past surgical history on file.  No family history on file.  History  Substance Use Topics  . Smoking status: Former Games developer  . Smokeless tobacco: Not on file  . Alcohol Use: No      Review of Systems  Constitutional: Negative for fever, chills, malaise/fatigue, diaphoresis, activity change, appetite change and fatigue.  HENT: Negative for hearing loss, ear pain, congestion, sore throat, facial swelling, rhinorrhea, mouth sores, trouble swallowing, neck pain, neck stiffness, dental problem, postnasal drip, sinus pressure and tinnitus.   Eyes: Positive for discharge. Negative for photophobia, pain, itching and visual disturbance.  Respiratory: Negative for cough, chest tightness and shortness of breath.   Cardiovascular: Negative for chest pain, palpitations, orthopnea, syncope and near-syncope.  Gastrointestinal: Negative for nausea, vomiting, abdominal pain, diarrhea, constipation and anorexia.  Genitourinary: Negative for dysuria.  Musculoskeletal: Negative for myalgias, back pain, joint swelling, arthralgias and gait problem.  Skin: Negative for rash and wound.  Neurological: Positive for headaches. Negative for dizziness, syncope, weakness and light-headedness.  Psychiatric/Behavioral: Negative for confusion and dysphoric mood. The patient is not nervous/anxious.     Allergies  Review of patient's allergies indicates no known allergies.  Home Medications   Current  Outpatient Rx  Name Route Sig Dispense Refill  . ACETAMINOPHEN 500 MG PO TABS Oral Take 1 tablet (500 mg total) by mouth every 6 (six) hours as needed for pain. 30 tablet 0  . ASPIRIN EC 81 MG PO TBEC Oral Take 81  mg by mouth every other day.     Marland Kitchen BICALUTAMIDE 50 MG PO TABS Oral Take 50 mg by mouth daily.    Marland Kitchen DILTIAZEM HCL ER COATED BEADS 120 MG PO CP24 Oral Take 120 mg by mouth daily.    Marland Kitchen FERROUS FUMARATE ER 50 MG PO TBCR Oral Take 200 mg by mouth daily with breakfast. After food    . FUROSEMIDE 20 MG PO TABS Oral Take 20 mg by mouth daily.     Marland Kitchen HYDRALAZINE HCL 50 MG PO TABS Oral Take 50 mg by mouth 2 (two) times daily.    . INDAPAMIDE 1.25 MG PO TABS Oral Take 1.25 mg by mouth every morning.    . ADULT MULTIVITAMIN W/MINERALS CH Oral Take 1 tablet by mouth daily.    . SENNA-DOCUSATE SODIUM 8.6-50 MG PO TABS Oral Take 1 tablet by mouth daily.    Marland Kitchen SIMVASTATIN 20 MG PO TABS Oral Take 20 mg by mouth at bedtime.    . EUCERIN EX CREA Topical Apply 1 application topically 2 (two) times daily. Apply to affected areas as directed.      BP 134/50  Pulse 62  Temp 97.9 F (36.6 C) (Oral)  Resp 16  SpO2 99%  Physical Exam  Constitutional: He is oriented to person, place, and time. He appears well-developed and well-nourished. He is active.  Non-toxic appearance. He does not have a sickly appearance. He does not appear ill. No distress. He is not intubated.       Pt laying on stretcher with the sheet over his head.  When he removes the sheet, he is wearing dark sunglasses.  He appears comfortable and is alert, interactive, and pleasant.  HENT:  Head: Normocephalic and atraumatic. Not macrocephalic and not microcephalic. Head is without raccoon's eyes, without Battle's sign, without laceration, without right periorbital erythema and without left periorbital erythema. Hair is normal. No trismus in the jaw.  Right Ear: Hearing, external ear and ear canal normal. No mastoid tenderness.  Left Ear: Hearing, tympanic membrane, external ear and ear canal normal. No mastoid tenderness.  Nose: Nose normal. No mucosal edema, rhinorrhea or nasal septal hematoma. No epistaxis. Right sinus exhibits no maxillary sinus  tenderness and no frontal sinus tenderness. Left sinus exhibits no maxillary sinus tenderness and no frontal sinus tenderness.  Mouth/Throat: Oropharynx is clear and moist and mucous membranes are normal. Mucous membranes are not pale, not dry and not cyanotic. Uvula swelling present. No oropharyngeal exudate, posterior oropharyngeal edema, posterior oropharyngeal erythema or tonsillar abscesses.       Left TM obscured by cerumen  Eyes: Right eye exhibits discharge. Right eye exhibits no exudate. Left eye exhibits discharge. Left eye exhibits no exudate.       Note severe, cataracts, clouding, and thickening/scarring that cover entire corneal surface.  Unable to clearly appreciate pupils.  Neck: Trachea normal, normal range of motion, full passive range of motion without pain and phonation normal. Neck supple. Normal carotid pulses and no JVD present. No tracheal tenderness, no spinous process tenderness and no muscular tenderness present. Carotid bruit is not present. No rigidity. No tracheal deviation and no edema present. No mass and no thyromegaly present.  Cardiovascular: Normal rate, regular  rhythm, S1 normal, S2 normal, intact distal pulses and normal pulses.   Occasional extrasystoles are present. PMI is not displaced.  Exam reveals no gallop and no decreased pulses.   No murmur heard. Pulmonary/Chest: Effort normal and breath sounds normal. No accessory muscle usage or stridor. No apnea, not tachypneic and not bradypneic. He is not intubated. No respiratory distress. He has no decreased breath sounds. He has no wheezes. He has no rhonchi. He has no rales.       Note bowel sounds also with auscultation of left lower lung fields  Abdominal: Soft. Normal appearance and bowel sounds are normal. He exhibits no pulsatile midline mass and no mass. There is no tenderness. There is no CVA tenderness. No hernia.  Musculoskeletal: Normal range of motion. He exhibits no edema and no tenderness.    Neurological: He is alert and oriented to person, place, and time. No cranial nerve deficit.  Skin: Skin is warm and dry. No rash noted. No erythema. No pallor.  Psychiatric: He has a normal mood and affect. His behavior is normal.    ED Course  Procedures (including critical care time)  Labs Reviewed - No data to display Ct Head Wo Contrast  06/27/2012  *RADIOLOGY REPORT*  Clinical Data: 78 year old male with severe headache.  CT HEAD WITHOUT CONTRAST  Technique:  Contiguous axial images were obtained from the base of the skull through the vertex without contrast.  Comparison: 06/25/2012.  Findings: Mild generalized cerebral volume loss and mild chronic small vessel white matter ischemic changes again noted.  No acute intracranial abnormalities are identified, including mass lesion or mass effect, hydrocephalus, extra-axial fluid collection, midline shift, hemorrhage, or acute infarction.  The visualized bony calvarium is unremarkable.  IMPRESSION: No evidence of acute intracranial abnormality.  Mild atrophy and chronic small vessel white matter ischemic changes.   Original Report Authenticated By: Rosendo Gros, M.D.      No diagnosis found.    MDM  Pt presents for evaluation of a headache.  He has been seen now 3 times over less than 1 week for the same headache.  He appears nontoxic and demonstrates no evidence of a CVA/TIA, sinusitis, or meningitis.  Note stable vital signs.  He had a CT scan on the 1st visit that only demonstrated chronic changes.  Will obtain basic labs and a repeat CT scan today.  Pt has only 4 noted visits here to the ER and they have all been since 06/08/12.  Will also treat with po tylenol and reassess.  1730.  Pt stable, NAD.  Note stable (consistent) VS.  CT scan of head again demonstrates chronic changes without any other acute findings.  Note also mild renal insufficiency that is unchanged from yesterday and actually better than lab findings from 2011.  Mild  hypokalemia is to be treated with po potassium.  Mild anemia on CBC is also not acute.  Plan discharge home to follow-up closely with his primary physician.  Will also refer him for further evaluation with a local neurologist.     Tobin Chad, MD 06/28/12 [phone]

## 2012-06-28 NOTE — ED Notes (Signed)
Patient to ED with C/O bad headache. Headache has been ongoing for 3 to 4 weeks. States that his head hurts all over and worsens when he walks. Denies nausea, vomiting, Numbness or tingling. Patientnot completely blind. States that he can see a little.

## 2012-06-28 NOTE — ED Notes (Signed)
Pt c/o headache x 3 days. Was seen and evaluated at [facility] yesterday for same.  Today states he is no better.

## 2014-07-30 ENCOUNTER — Inpatient Hospital Stay (HOSPITAL_COMMUNITY)
Admission: EM | Admit: 2014-07-30 | Discharge: 2014-08-08 | DRG: 853 | Disposition: A | Discharge status: Skilled Nursing Facility | Payer: Medicare Other | Attending: Internal Medicine | Admitting: Internal Medicine

## 2014-07-30 ENCOUNTER — Emergency Department (HOSPITAL_COMMUNITY): Payer: Medicare Other

## 2014-07-30 ENCOUNTER — Encounter (HOSPITAL_COMMUNITY): Payer: Self-pay | Admitting: Emergency Medicine

## 2014-07-30 DIAGNOSIS — C61 Malignant neoplasm of prostate: Secondary | ICD-10-CM

## 2014-07-30 DIAGNOSIS — Z87891 Personal history of nicotine dependence: Secondary | ICD-10-CM

## 2014-07-30 DIAGNOSIS — D508 Other iron deficiency anemias: Secondary | ICD-10-CM | POA: Diagnosis present

## 2014-07-30 DIAGNOSIS — I1310 Hypertensive heart and chronic kidney disease without heart failure, with stage 1 through stage 4 chronic kidney disease, or unspecified chronic kidney disease: Secondary | ICD-10-CM | POA: Diagnosis present

## 2014-07-30 DIAGNOSIS — E1122 Type 2 diabetes mellitus with diabetic chronic kidney disease: Secondary | ICD-10-CM | POA: Diagnosis present

## 2014-07-30 DIAGNOSIS — E874 Mixed disorder of acid-base balance: Secondary | ICD-10-CM | POA: Diagnosis present

## 2014-07-30 DIAGNOSIS — E43 Unspecified severe protein-calorie malnutrition: Secondary | ICD-10-CM | POA: Diagnosis present

## 2014-07-30 DIAGNOSIS — A4159 Other Gram-negative sepsis: Secondary | ICD-10-CM | POA: Diagnosis present

## 2014-07-30 DIAGNOSIS — N138 Other obstructive and reflux uropathy: Secondary | ICD-10-CM | POA: Diagnosis present

## 2014-07-30 DIAGNOSIS — E860 Dehydration: Secondary | ICD-10-CM | POA: Diagnosis present

## 2014-07-30 DIAGNOSIS — Z6826 Body mass index (BMI) 26.0-26.9, adult: Secondary | ICD-10-CM

## 2014-07-30 DIAGNOSIS — D649 Anemia, unspecified: Secondary | ICD-10-CM

## 2014-07-30 DIAGNOSIS — R4182 Altered mental status, unspecified: Secondary | ICD-10-CM | POA: Diagnosis present

## 2014-07-30 DIAGNOSIS — N189 Chronic kidney disease, unspecified: Secondary | ICD-10-CM

## 2014-07-30 DIAGNOSIS — H548 Legal blindness, as defined in USA: Secondary | ICD-10-CM | POA: Diagnosis present

## 2014-07-30 DIAGNOSIS — D696 Thrombocytopenia, unspecified: Secondary | ICD-10-CM | POA: Diagnosis present

## 2014-07-30 DIAGNOSIS — I10 Essential (primary) hypertension: Secondary | ICD-10-CM

## 2014-07-30 DIAGNOSIS — N1330 Unspecified hydronephrosis: Secondary | ICD-10-CM | POA: Diagnosis present

## 2014-07-30 DIAGNOSIS — N184 Chronic kidney disease, stage 4 (severe): Secondary | ICD-10-CM | POA: Diagnosis present

## 2014-07-30 DIAGNOSIS — F0390 Unspecified dementia without behavioral disturbance: Secondary | ICD-10-CM | POA: Diagnosis present

## 2014-07-30 DIAGNOSIS — E785 Hyperlipidemia, unspecified: Secondary | ICD-10-CM | POA: Diagnosis present

## 2014-07-30 DIAGNOSIS — N19 Unspecified kidney failure: Secondary | ICD-10-CM

## 2014-07-30 DIAGNOSIS — N390 Urinary tract infection, site not specified: Secondary | ICD-10-CM | POA: Diagnosis present

## 2014-07-30 DIAGNOSIS — N179 Acute kidney failure, unspecified: Secondary | ICD-10-CM | POA: Diagnosis present

## 2014-07-30 DIAGNOSIS — N202 Calculus of kidney with calculus of ureter: Secondary | ICD-10-CM | POA: Diagnosis present

## 2014-07-30 DIAGNOSIS — Z8546 Personal history of malignant neoplasm of prostate: Secondary | ICD-10-CM

## 2014-07-30 DIAGNOSIS — E1165 Type 2 diabetes mellitus with hyperglycemia: Secondary | ICD-10-CM | POA: Diagnosis present

## 2014-07-30 DIAGNOSIS — B964 Proteus (mirabilis) (morganii) as the cause of diseases classified elsewhere: Secondary | ICD-10-CM | POA: Diagnosis present

## 2014-07-30 DIAGNOSIS — G9340 Encephalopathy, unspecified: Secondary | ICD-10-CM

## 2014-07-30 DIAGNOSIS — D631 Anemia in chronic kidney disease: Secondary | ICD-10-CM | POA: Diagnosis present

## 2014-07-30 DIAGNOSIS — W19XXXA Unspecified fall, initial encounter: Secondary | ICD-10-CM | POA: Diagnosis present

## 2014-07-30 DIAGNOSIS — Z7982 Long term (current) use of aspirin: Secondary | ICD-10-CM

## 2014-07-30 DIAGNOSIS — E669 Obesity, unspecified: Secondary | ICD-10-CM | POA: Diagnosis present

## 2014-07-30 DIAGNOSIS — Z9229 Personal history of other drug therapy: Secondary | ICD-10-CM

## 2014-07-30 DIAGNOSIS — Z79899 Other long term (current) drug therapy: Secondary | ICD-10-CM | POA: Diagnosis not present

## 2014-07-30 DIAGNOSIS — G9341 Metabolic encephalopathy: Secondary | ICD-10-CM | POA: Diagnosis present

## 2014-07-30 DIAGNOSIS — R739 Hyperglycemia, unspecified: Secondary | ICD-10-CM

## 2014-07-30 DIAGNOSIS — M6282 Rhabdomyolysis: Secondary | ICD-10-CM | POA: Diagnosis present

## 2014-07-30 DIAGNOSIS — N132 Hydronephrosis with renal and ureteral calculous obstruction: Secondary | ICD-10-CM

## 2014-07-30 DIAGNOSIS — R0602 Shortness of breath: Secondary | ICD-10-CM

## 2014-07-30 HISTORY — DX: Hyperlipidemia, unspecified: E78.5

## 2014-07-30 LAB — URINALYSIS, ROUTINE W REFLEX MICROSCOPIC
Bilirubin Urine: NEGATIVE
GLUCOSE, UA: NEGATIVE mg/dL
HGB URINE DIPSTICK: NEGATIVE
KETONES UR: NEGATIVE mg/dL
Nitrite: NEGATIVE
PROTEIN: 30 mg/dL — AB
Specific Gravity, Urine: 1.017 (ref 1.005–1.030)
UROBILINOGEN UA: 1 mg/dL (ref 0.0–1.0)
pH: 5.5 (ref 5.0–8.0)

## 2014-07-30 LAB — BASIC METABOLIC PANEL
ANION GAP: 18 — AB (ref 5–15)
BUN: 66 mg/dL — ABNORMAL HIGH (ref 6–23)
CHLORIDE: 103 meq/L (ref 96–112)
CO2: 18 mEq/L — ABNORMAL LOW (ref 19–32)
Calcium: 8.1 mg/dL — ABNORMAL LOW (ref 8.4–10.5)
Creatinine, Ser: 4.69 mg/dL — ABNORMAL HIGH (ref 0.50–1.35)
GFR calc non Af Amer: 10 mL/min — ABNORMAL LOW (ref >90)
GFR, EST AFRICAN AMERICAN: 12 mL/min — AB (ref >90)
Glucose, Bld: 122 mg/dL — ABNORMAL HIGH (ref 70–99)
POTASSIUM: 3.7 meq/L (ref 3.7–5.3)
SODIUM: 139 meq/L (ref 137–147)

## 2014-07-30 LAB — CK
CK TOTAL: 1152 U/L — AB (ref 7–232)
Total CK: 1215 U/L — ABNORMAL HIGH (ref 7–232)

## 2014-07-30 LAB — URINE MICROSCOPIC-ADD ON

## 2014-07-30 LAB — CBC
HEMATOCRIT: 28.9 % — AB (ref 39.0–52.0)
Hemoglobin: 9.4 g/dL — ABNORMAL LOW (ref 13.0–17.0)
MCH: 26.1 pg (ref 26.0–34.0)
MCHC: 32.5 g/dL (ref 30.0–36.0)
MCV: 80.3 fL (ref 78.0–100.0)
PLATELETS: 146 10*3/uL — AB (ref 150–400)
RBC: 3.6 MIL/uL — ABNORMAL LOW (ref 4.22–5.81)
RDW: 14 % (ref 11.5–15.5)
WBC: 17.7 10*3/uL — AB (ref 4.0–10.5)

## 2014-07-30 LAB — COMPREHENSIVE METABOLIC PANEL
ALBUMIN: 2.9 g/dL — AB (ref 3.5–5.2)
ALT: 20 U/L (ref 0–53)
ANION GAP: 16 — AB (ref 5–15)
AST: 35 U/L (ref 0–37)
Alkaline Phosphatase: 81 U/L (ref 39–117)
BUN: 60 mg/dL — AB (ref 6–23)
CALCIUM: 8.4 mg/dL (ref 8.4–10.5)
CHLORIDE: 99 meq/L (ref 96–112)
CO2: 21 mEq/L (ref 19–32)
CREATININE: 4.41 mg/dL — AB (ref 0.50–1.35)
GFR calc Af Amer: 13 mL/min — ABNORMAL LOW (ref >90)
GFR calc non Af Amer: 11 mL/min — ABNORMAL LOW (ref >90)
Glucose, Bld: 231 mg/dL — ABNORMAL HIGH (ref 70–99)
Potassium: 4.8 mEq/L (ref 3.7–5.3)
Sodium: 136 mEq/L — ABNORMAL LOW (ref 137–147)
TOTAL PROTEIN: 6.9 g/dL (ref 6.0–8.3)
Total Bilirubin: 0.4 mg/dL (ref 0.3–1.2)

## 2014-07-30 LAB — TROPONIN I: Troponin I: <0.3 ng/mL (ref <0.30)

## 2014-07-30 LAB — CBG MONITORING, ED: GLUCOSE-CAPILLARY: 238 mg/dL — AB (ref 70–99)

## 2014-07-30 LAB — CK TOTAL AND CKMB (NOT AT ARMC)
CK, MB: 4.7 ng/mL — AB (ref 0.3–4.0)
Relative Index: 0.6 (ref 0.0–2.5)
Total CK: 754 U/L — ABNORMAL HIGH (ref 7–232)

## 2014-07-30 LAB — GLUCOSE, CAPILLARY: GLUCOSE-CAPILLARY: 126 mg/dL — AB (ref 70–99)

## 2014-07-30 LAB — RETICULOCYTES
RBC.: 3.57 MIL/uL — ABNORMAL LOW (ref 4.22–5.81)
RETIC CT PCT: 1 % (ref 0.4–3.1)
Retic Count, Absolute: 35.7 10*3/uL (ref 19.0–186.0)

## 2014-07-30 LAB — MRSA PCR SCREENING: MRSA BY PCR: NEGATIVE

## 2014-07-30 MED ORDER — ONDANSETRON HCL 4 MG PO TABS: 4.0000 mg | ORAL_TABLET | Freq: Four times a day (QID) | ORAL | Status: DC | PRN

## 2014-07-30 MED ORDER — SODIUM CHLORIDE 0.9 % IV SOLN
INTRAVENOUS | Status: DC
  Administered 2014-07-30 – 2014-07-31 (×3): via INTRAVENOUS

## 2014-07-30 MED ORDER — SENNA 8.6 MG PO TABS
1.0000 | ORAL_TABLET | Freq: Two times a day (BID) | ORAL | Status: DC
  Administered 2014-07-30 – 2014-08-08 (×16): 8.6 mg via ORAL
  Filled 2014-07-30 (×15): qty 1

## 2014-07-30 MED ORDER — ACETAMINOPHEN 325 MG PO TABS
650.0000 mg | ORAL_TABLET | Freq: Four times a day (QID) | ORAL | Status: DC | PRN
  Filled 2014-07-30: qty 2

## 2014-07-30 MED ORDER — DEXTROSE 5 % IV SOLN
1.0000 g | INTRAVENOUS | Status: DC
  Administered 2014-07-31 – 2014-08-01 (×2): 1 g via INTRAVENOUS
  Filled 2014-07-30 (×3): qty 10

## 2014-07-30 MED ORDER — ONDANSETRON HCL 4 MG/2ML IJ SOLN: 4.0000 mg | Freq: Four times a day (QID) | INTRAMUSCULAR | Status: DC | PRN

## 2014-07-30 MED ORDER — FERROUS FUMARATE 325 (106 FE) MG PO TABS
106.0000 mg | ORAL_TABLET | Freq: Every day | ORAL | Status: DC
  Administered 2014-07-31 – 2014-08-08 (×9): 106 mg via ORAL
  Filled 2014-07-30 (×10): qty 1

## 2014-07-30 MED ORDER — ACETAMINOPHEN 650 MG RE SUPP
650.0000 mg | Freq: Four times a day (QID) | RECTAL | Status: DC | PRN
  Administered 2014-08-04: 650 mg via RECTAL
  Filled 2014-07-30: qty 1

## 2014-07-30 MED ORDER — BIOTENE DRY MOUTH MT LIQD
15.0000 mL | Freq: Two times a day (BID) | OROMUCOSAL | Status: DC
  Administered 2014-07-30 – 2014-08-08 (×16): 15 mL via OROMUCOSAL

## 2014-07-30 MED ORDER — DEXTROSE 5 % IV SOLN
1.0000 g | Freq: Once | INTRAVENOUS | Status: AC
  Administered 2014-07-30: 1 g via INTRAVENOUS
  Filled 2014-07-30: qty 10

## 2014-07-30 MED ORDER — ALBUTEROL SULFATE (2.5 MG/3ML) 0.083% IN NEBU: 2.5000 mg | INHALATION_SOLUTION | RESPIRATORY_TRACT | Status: DC | PRN

## 2014-07-30 MED ORDER — POLYETHYLENE GLYCOL 3350 17 G PO PACK
17.0000 g | PACK | Freq: Every day | ORAL | Status: DC | PRN
  Filled 2014-07-30: qty 1

## 2014-07-30 MED ORDER — SODIUM CHLORIDE 0.9 % IV BOLUS (SEPSIS)
1000.0000 mL | Freq: Once | INTRAVENOUS | Status: AC
  Administered 2014-07-30: 1000 mL via INTRAVENOUS

## 2014-07-30 MED ORDER — HEPARIN SODIUM (PORCINE) 5000 UNIT/ML IJ SOLN
5000.0000 [IU] | Freq: Three times a day (TID) | INTRAMUSCULAR | Status: DC
  Administered 2014-07-30 – 2014-08-01 (×5): 5000 [IU] via SUBCUTANEOUS
  Filled 2014-07-30 (×8): qty 1

## 2014-07-30 MED ORDER — INSULIN ASPART 100 UNIT/ML ~~LOC~~ SOLN
0.0000 [IU] | Freq: Three times a day (TID) | SUBCUTANEOUS | Status: DC
  Administered 2014-07-31: 2 [IU] via SUBCUTANEOUS
  Administered 2014-08-01: 1 [IU] via SUBCUTANEOUS
  Administered 2014-08-01 – 2014-08-02 (×2): 2 [IU] via SUBCUTANEOUS
  Administered 2014-08-02 (×2): 1 [IU] via SUBCUTANEOUS
  Administered 2014-08-03 (×3): 2 [IU] via SUBCUTANEOUS
  Administered 2014-08-05: 1 [IU] via SUBCUTANEOUS
  Administered 2014-08-05: 2 [IU] via SUBCUTANEOUS
  Administered 2014-08-05: 1 [IU] via SUBCUTANEOUS

## 2014-07-30 MED ORDER — CYANOCOBALAMIN 500 MCG PO TABS
500.0000 ug | ORAL_TABLET | Freq: Every day | ORAL | Status: DC
  Administered 2014-07-31 – 2014-08-08 (×9): 500 ug via ORAL
  Filled 2014-07-30 (×9): qty 1

## 2014-07-30 MED ORDER — BICALUTAMIDE 50 MG PO TABS
50.0000 mg | ORAL_TABLET | Freq: Every day | ORAL | Status: DC
  Administered 2014-07-31 – 2014-08-08 (×9): 50 mg via ORAL
  Filled 2014-07-30 (×11): qty 1

## 2014-07-30 MED ORDER — BISACODYL 5 MG PO TBEC: 5.0000 mg | DELAYED_RELEASE_TABLET | Freq: Every day | ORAL | Status: DC | PRN

## 2014-07-30 NOTE — ED Notes (Signed)
Per EMS, patient sent to ED for hyperglycemia Patient states that he is here in ED because he fell Patient is unable to elaborate on if/when/how he fell Patient noted to have hx of blindness, but appears confused Patient oriented to name, place (knows he's in the hospital), month Patient NOT oriented to year or situation

## 2014-07-30 NOTE — ED Notes (Signed)
Bed: WA08 Expected date:  Expected time:  Means of arrival:  Comments: [facility]-high sugar

## 2014-07-30 NOTE — Progress Notes (Signed)
ANTIBIOTIC CONSULT NOTE - INITIAL  Pharmacy Consult for Rocephin Indication: UTI  No Known Allergies  Patient Measurements:     Vital Signs: Temp: 97.8 F (36.6 C) (10/16 2136) Temp Source: Oral (10/16 2136) BP: 91/28 mmHg (10/16 2136) Pulse Rate: 94 (10/16 2136) Intake/Output from previous day:   Intake/Output from this shift:    Labs:  Recent Labs  07/30/14 1337  WBC 17.7*  HGB 9.4*  PLT 146*  CREATININE 4.41*   CrCl is unknown because there is no height on file for the current visit. No results found for this basename: VANCOTROUGH, VANCOPEAK, VANCORANDOM, GENTTROUGH, GENTPEAK, GENTRANDOM, TOBRATROUGH, TOBRAPEAK, TOBRARND, AMIKACINPEAK, AMIKACINTROU, AMIKACIN,  in the last 72 hours   Microbiology: No results found for this or any previous visit (from the past 720 hour(s)).  Medical History: Past Medical History  Diagnosis Date  . 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 Chronic kidney disease, stage III (moderate)   . Legal blindness, as defined in [place]   . Carcinoma in situ of prostate   . Anemia, unspecified   . Type II or unspecified type diabetes mellitus with renal manifestations, not stated as uncontrolled   . Other and unspecified hyperlipidemia   . Edema   . Obesity, unspecified   . Diabetes mellitus   . Dyslipidemia    Assessment: 16 yoM with acute on chronic renal failure, acute encephalopathy, HTN. Pharmacy consulted to dose Rocephin for UTI. (First dose given in ER)  Tmax: AF WBCs: 17.7 Renal: SCR 4.41 CrCl < 20  10/16 urine: sent  Goal of Therapy:  Appropriate antibiotic dosing for renal function; eradication of infection  Plan:  Start Rocephin 1g IV Q24H F/u culture results  Kizzie Furnish, PharmD Pager: [phone] 07/30/2014 9:40 PM

## 2014-07-30 NOTE — Progress Notes (Signed)
Utilization Review completed.  Neeraj Housand RN CM  

## 2014-07-30 NOTE — ED Provider Notes (Addendum)
TIME SEEN: 1:30 PM  CHIEF COMPLAINT: Altered mental status, unwitnessed fall  HPI: Patient is a 80 y.o. M with history of heart failure, chronic kidney disease, diabetes, dementia, legal blindness who presents to the emergency department with multiple complaints. Per patient, he fell this morning. He is unable to tell me why he fell. Staff reports that they found him on the ground. He denies that he is having any pain. It is unclear if he hit his head. He is not on anticoagulation.   Staff also reports that he has not been acting normally. They report he is normally able to ambulate, feeding himself and carry on a conversation which he has not been doing today. No focal neurologic deficits on exam.   They also report that he has had an elevated blood glucose today, in the 300s. He normally is in the 200s. He is not on insulin.  ROS: Level V caveat for dementia  PAST MEDICAL HISTORY/PAST SURGICAL HISTORY:  Past Medical History  Diagnosis Date  . 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 Chronic kidney disease, stage III (moderate)   . Legal blindness, as defined in [place]   . Carcinoma in situ of prostate   . Anemia, unspecified   . Type II or unspecified type diabetes mellitus with renal manifestations, not stated as uncontrolled   . Other and unspecified hyperlipidemia   . Edema   . Obesity, unspecified   . Diabetes mellitus     MEDICATIONS:  Prior to Admission medications   Medication Sig Start Date End Date Taking? Authorizing Provider  aspirin EC 81 MG tablet Take 81 mg by mouth every other day.     Historical Provider, MD  bicalutamide (CASODEX) 50 MG tablet Take 50 mg by mouth daily.    Historical Provider, MD  diltiazem (CARDIZEM CD) 120 MG 24 hr capsule Take 120 mg by mouth daily.    Historical Provider, MD  ferrous fumarate (FERRO-SEQUELS) 50 MG CR tablet Take 200 mg by mouth  daily with breakfast. After food    Historical Provider, MD  furosemide (LASIX) 20 MG tablet Take 20 mg by mouth daily.     Historical Provider, MD  hydrALAZINE (APRESOLINE) 50 MG tablet Take 50 mg by mouth 2 (two) times daily.    Historical Provider, MD  indapamide (LOZOL) 1.25 MG tablet Take 1.25 mg by mouth every morning.    Historical Provider, MD  Multiple Vitamin (MULTIVITAMIN WITH MINERALS) TABS Take 1 tablet by mouth daily.    Historical Provider, MD  sennosides-docusate sodium (SENOKOT-S) 8.6-50 MG tablet Take 1 tablet by mouth daily.    Historical Provider, MD  simvastatin (ZOCOR) 20 MG tablet Take 20 mg by mouth at bedtime.    Historical Provider, MD  Skin Protectants, Misc. (EUCERIN) cream Apply 1 application topically 2 (two) times daily. Apply to affected areas as directed.    Historical Provider, MD    ALLERGIES:  No Known Allergies  SOCIAL HISTORY:  History  Substance Use Topics  . Smoking status: Former Research scientist (life sciences)  . Smokeless tobacco: Not on file  . Alcohol Use: No    FAMILY HISTORY: History reviewed. No pertinent family history.  EXAM: BP 110/74  Pulse 79  Temp(Src) 98.5 F (36.9 C) (Oral)  Resp 17  SpO2 98% CONSTITUTIONAL: Alert and oriented to person only and responds appropriately to questions intermittently. Well-appearing; well-nourished HEAD: Normocephalic EYES: Conjunctivae clear,  PERRL ENT: normal nose; no rhinorrhea; moist mucous membranes; pharynx without lesions noted NECK: Supple, no meningismus, no LAD; no midline spinal tenderness or step-off or deformity  CARD: RRR; S1 and S2 appreciated; no murmurs, no clicks, no rubs, no gallops RESP: Normal chest excursion without splinting or tachypnea; breath sounds clear and equal bilaterally; no wheezes, no rhonchi, no rales, chest wall nontender to palpation without ecchymosis or crepitus or deformity ABD/GI: Normal bowel sounds; non-distended; soft, non-tender, no rebound, no guarding BACK:  The back  appears normal and is non-tender to palpation, there is no CVA tenderness; no midline spinal tenderness or step-off or deformity PELVIC:  Stable, nontender EXT: Normal ROM in all joints; non-tender to palpation; no edema; normal capillary refill; no cyanosis; no signs of bony injury, compartment soft, no joint effusions    SKIN: Normal color for age and race; warm NEURO: Moves all extremities equally; reports normal sensation diffusely, cranial nerves II through XII intact other than some mild left facial droop which is unclear if this is chronic, no pronator drift PSYCH: The patient's mood and manner are appropriate. Grooming and personal hygiene are appropriate.  MEDICAL DECISION MAKING: Patient here with unwitnessed fall, altered mental status and hyperglycemia. We'll obtain labs, CT of his head and cervical spine, urinalysis. We'll give IV fluids. He is well-appearing, hemodynamically stable and nontoxic.  ED PROGRESS: Patient is a leukocytosis of 17 with left shift. He does appear to have a urinary tract infection. Culture pending. We'll give ceftriaxone. We'll also obtain a chest x-ray. His labs show acute on chronic renal failure. We'll continue IV hydration.     Chest x-ray shows no acute antibodies. CT head and cervical spine show no infarct, hemorrhage or other injury. Discussed with hospitalist for admission to step down bed for treatment for his UTI and acute on chronic renal failure.     EKG Interpretation  Date/Time:  Friday July 30 2014 13:07:55 EDT Ventricular Rate:  81 PR Interval:  251 QRS Duration: 83 QT Interval:  372 QTC Calculation: 432 R Axis:   58 Text Interpretation:  Sinus rhythm Prolonged PR interval Confirmed by Mattison Golay,  DO, Sharmaine Bain [phone]) on 07/30/2014 1:15:19 PM         [place], DO 07/30/14 [place], DO 07/30/14 1620

## 2014-07-30 NOTE — ED Notes (Signed)
This nurse called [place] to clarify if patient fell Per Adah Salvage (patient's nurse), "Third shift said that they had found him down on the ground and that is unusual for him. He usually walks with a cane, but he almost fell over again this morning." Will inform EDP

## 2014-07-30 NOTE — ED Notes (Signed)
Patient transported to CT via stretcher Patient in NAD upon leaving for testing 

## 2014-07-30 NOTE — ED Notes (Signed)
Patient here from nursing home, brought in by EMS for hyperglycemia CBG 300, per EMS

## 2014-07-30 NOTE — H&P (Signed)
Patient Demographics  Luke Johnson, is a 80 y.o. male  MRN: [id number]   DOB - September 06, 1926  Admit Date - 07/30/2014  Outpatient Primary MD for the patient is [name], MD   With History of -  Past Medical History  Diagnosis Date  . 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 Chronic kidney disease, stage III (moderate)   . Legal blindness, as defined in [place]   . Carcinoma in situ of prostate   . Anemia, unspecified   . Type II or unspecified type diabetes mellitus with renal manifestations, not stated as uncontrolled   . Other and unspecified hyperlipidemia   . Edema   . Obesity, unspecified   . Diabetes mellitus   . Dyslipidemia       Past Surgical History  Procedure Laterality Date  . No past surgeries      in for   Chief Complaint  Patient presents with  . Hyperglycemia  . Fall     HPI  Luke Johnson  is a 80 y.o. male, with known past medical history of hypertension, blindness, malignant neoplasm of prostate, dyslipidemia, edema, anemia, represents from skilled nursing facility as he was found on the floor, patient is a poor historian, can't provide any reliable history, spoke with patient doesn't over the phone, report patient is usually active, awake alert with good mentation, ambulates without assistance, as per nursing home over the last 2 days patient has not been acting himself but afebrile, no leukocytosis, was on the floor for unknown period of time, denies any pain anywhere, has no focal significant deficits, CT head,/CT cervical spine, did not show any acute finding or any. Patient workup was significant for UTI, will leukocytosis of 17,000 ,started on Rocephin in ED, as well patient was found to be in acute on chronic renal failure last known creatinine 1.9, today is 4.4, no urinary retention 150 cc on bladder scan. As well patient was noticed to have  hyperglycemia with glucose in the 300s range, and he does not carry a diagnosis of diabetes mellitus as per nursing home documentation, as well he does not appear to be on any diabetic medications.  Review of Systems    In addition to the HPI above,  Patient is confused, his review of systems not very reliable. No Fever-chills, No Headache, No changes with Vision or hearing, No problems swallowing food or Liquids, No Chest pain, Cough or Shortness of Breath, No Abdominal pain, No Nausea or Vommitting, Bowel movements are regular, No Blood in stool or Urine, No dysuria, No new skin rashes or bruises, No new joints pains-aches,  No new weakness, tingling, numbness in any extremity, No recent weight gain or loss, No polyuria, polydypsia or polyphagia, No significant Mental Stressors.  A full 10 point Review of Systems was done, except as stated above, all other Review of Systems were negative.   Social History History  Substance Use Topics  . Smoking status: Former Research scientist (life sciences)  . Smokeless tobacco: Never Used  . Alcohol Use: No     Family History History reviewed. No pertinent family history.   Prior to Admission medications   Medication Sig Start Date End Date Taking? Authorizing Provider  aspirin EC 81 MG tablet Take 81 mg by mouth every other day.    Yes Historical Provider, MD  bicalutamide (CASODEX) 50 MG tablet Take 50 mg by mouth daily.  Yes Historical Provider, MD  cyanocobalamin 500 MCG tablet Take 500 mcg by mouth daily.   Yes Historical Provider, MD  diltiazem (CARDIZEM CD) 120 MG 24 hr capsule Take 120 mg by mouth daily.   Yes Historical Provider, MD  ferrous fumarate (FERRO-SEQUELS) 50 MG CR tablet Take 200 mg by mouth daily after breakfast.    Yes Historical Provider, MD  furosemide (LASIX) 20 MG tablet Take 20 mg by mouth daily.    Yes Historical Provider, MD  hydrALAZINE (APRESOLINE) 50 MG tablet Take 50 mg by mouth 2 (two) times daily.   Yes Historical Provider,  MD  Multiple Vitamin (MULTIVITAMIN WITH MINERALS) TABS Take 1 tablet by mouth daily.   Yes Historical Provider, MD  sennosides-docusate sodium (SENOKOT-S) 8.6-50 MG tablet Take 1 tablet by mouth daily.   Yes Historical Provider, MD  simvastatin (ZOCOR) 20 MG tablet Take 20 mg by mouth at bedtime.   Yes Historical Provider, MD  Skin Protectants, Misc. (EUCERIN) cream Apply 1 application topically 2 (two) times daily. Apply to affected areas as directed.   Yes Historical Provider, MD    No Known Allergies  Physical Exam  Vitals  Blood pressure 121/60, pulse 92, temperature 98.5 F (36.9 C), temperature source Oral, resp. rate 23, SpO2 97.00%.   1. General elderly frail appearing male lying in bed in NAD,    2. , Not Suicidal or Homicidal, Awake Alert, Oriented X 1.  3. No F.N deficits, ALL C.Nerves Intact, Strength 5/5 all 4 extremities, Sensation intact all 4 extremities, Plantars down going.  4. patient is blind, with bilateral total corneal opacification , dry Oral Mucosa.  5. Supple Neck, No JVD, No cervical lymphadenopathy appriciated, No Carotid Bruits.  6. Symmetrical Chest wall movement, Good air movement bilaterally, CTAB.  7. RRR, No Gallops, Rubs or Murmurs, No Parasternal Heave.  8. Positive Bowel Sounds, Abdomen Soft, No tenderness, No organomegaly appriciated,No rebound -guarding or rigidity.  9.  No Cyanosis, daily Skin Turgor, No Skin Rash or Bruise.  10. Good muscle tone,  joints appear normal , no effusions, Normal ROM.  11. No Palpable Lymph Nodes in Neck or Axillae   Data Review  CBC  Recent Labs Lab 07/30/14 1337  WBC 17.7*  HGB 9.4*  HCT 28.9*  PLT 146*  MCV 80.3  MCH 26.1  MCHC 32.5  RDW 14.0   ------------------------------------------------------------------------------------------------------------------  Chemistries   Recent Labs Lab 07/30/14 1337  NA 136*  K 4.8  CL 99  CO2 21  GLUCOSE 231*  BUN 60*  CREATININE 4.41*    CALCIUM 8.4  AST 35  ALT 20  ALKPHOS 81  BILITOT 0.4   ------------------------------------------------------------------------------------------------------------------ CrCl is unknown because there is no height on file for the current visit. ------------------------------------------------------------------------------------------------------------------ No results found for this basename: TSH, T4TOTAL, FREET3, T3FREE, THYROIDAB,  in the last 72 hours   Coagulation profile No results found for this basename: INR, PROTIME,  in the last 168 hours ------------------------------------------------------------------------------------------------------------------- No results found for this basename: DDIMER,  in the last 72 hours -------------------------------------------------------------------------------------------------------------------  Cardiac Enzymes  Recent Labs Lab 07/30/14 1353  TROPONINI <0.30   ------------------------------------------------------------------------------------------------------------------ No components found with this basename: POCBNP,    ---------------------------------------------------------------------------------------------------------------  Urinalysis    Component Value Date/Time   COLORURINE AMBER* 07/30/2014 1313   APPEARANCEUR CLOUDY* 07/30/2014 1313   LABSPEC 1.017 07/30/2014 1313   PHURINE 5.5 07/30/2014 [place] 07/30/2014 [place] 07/30/2014 [place] 07/30/2014 1313  KETONESUR NEGATIVE 07/30/2014 1313   PROTEINUR 30* 07/30/2014 1313   UROBILINOGEN 1.0 07/30/2014 1313   NITRITE NEGATIVE 07/30/2014 1313   LEUKOCYTESUR MODERATE* 07/30/2014 1313    ----------------------------------------------------------------------------------------------------------------  Imaging results:   Dg Chest 2 View  07/30/2014   CLINICAL DATA:  Found down on the floor. Hyperglycemia with  history of diabetes.  EXAM: CHEST  2 VIEW  COMPARISON:  12/2009  FINDINGS: Cardiac silhouette is upper limits of normal to mildly enlarged in size. Large hiatal hernia is again seen. The lungs are clear. No pleural effusion or pneumothorax is identified. No acute osseous abnormality is seen.  IMPRESSION: No active cardiopulmonary disease.  Hiatal hernia.   Electronically Signed   By: Logan Bores   On: 07/30/2014 14:44   Ct Head Wo Contrast  07/30/2014   CLINICAL DATA:  Initial evaluation for confusion, patient states that he fell but cannot remember within where or hila  EXAM: CT HEAD WITHOUT CONTRAST  CT CERVICAL SPINE WITHOUT CONTRAST  TECHNIQUE: Multidetector CT imaging of the head and cervical spine was performed following the standard protocol without intravenous contrast. Multiplanar CT image reconstructions of the cervical spine were also generated.  COMPARISON:  06/28/2012  FINDINGS: CT HEAD FINDINGS  Moderate to severe atrophy. Mild low attenuation in the deep white matter. No hemorrhage or extra-axial fluid. No evidence of mass or vascular territory infarct. No hydrocephalus. Calvarium is intact.  CT CERVICAL SPINE FINDINGS  Multifocal intravenous air in the right neck likely due to IV access and injection. No other soft tissue abnormalities.  No fracture identified. No paraspinous hematoma. Normal anterior-posterior alignment. Multilevel degenerative disc disease. Degenerative change also involves posterior elements. Fusion of the posterior elements at C4 and C5 may be congenital.  IMPRESSION: No acute intracranial abnormality. No evidence of acute traumatic injury involving the cervical spine.   Electronically Signed   By: Skipper Cliche M.D.   On: 07/30/2014 14:42   Ct Cervical Spine Wo Contrast  07/30/2014   CLINICAL DATA:  Initial evaluation for confusion, patient states that he fell but cannot remember within where or hila  EXAM: CT HEAD WITHOUT CONTRAST  CT CERVICAL SPINE WITHOUT CONTRAST   TECHNIQUE: Multidetector CT imaging of the head and cervical spine was performed following the standard protocol without intravenous contrast. Multiplanar CT image reconstructions of the cervical spine were also generated.  COMPARISON:  06/28/2012  FINDINGS: CT HEAD FINDINGS  Moderate to severe atrophy. Mild low attenuation in the deep white matter. No hemorrhage or extra-axial fluid. No evidence of mass or vascular territory infarct. No hydrocephalus. Calvarium is intact.  CT CERVICAL SPINE FINDINGS  Multifocal intravenous air in the right neck likely due to IV access and injection. No other soft tissue abnormalities.  No fracture identified. No paraspinous hematoma. Normal anterior-posterior alignment. Multilevel degenerative disc disease. Degenerative change also involves posterior elements. Fusion of the posterior elements at C4 and C5 may be congenital.  IMPRESSION: No acute intracranial abnormality. No evidence of acute traumatic injury involving the cervical spine.   Electronically Signed   By: Skipper Cliche M.D.   On: 07/30/2014 14:42       Assessment & Plan  Active Problems:   Acute on chronic renal failure   Acute encephalopathy   UTI (lower urinary tract infection)   Prostate cancer   HTN (hypertension)   Dyslipidemia   Hyperglycemia   Anemia     acute on chronic renal failure -Baseline creatinine 1.9, today is 4.4 -Most likely related to  volume depletion and dehydration, no evidence of urinary retention at this point. Gerrit Friends is a possibility as he was on the floor for unknown period of time, will check total CK,  -Will continue with aggressive IV hydration, if no improvement will check renal ultrasound. -Will continue to hold Lasix  Acute encephalopathy -Metabolic encephalopathy secondary to his UTI, worsening renal failure, no acute finding in CT head, no focal neurological deficit  UTI -Continue with Rocephin, follow with urine culture  Hypertension -Will hold all  meds as blood pressure is on the lower side  Hyperglycemia -There is no official diagnosis of diabetes mellitus, will check hemoglobin A1c, will start an insulin sliding scale.  Hyperlipidemia -Will hold statin until Rhabdomyolysis is ruled out  Prostate cancer -Continue with Casodex  DVT Prophylaxis Heparin ,SCDs  AM Labs Ordered, also please review Full Orders  Family Communication: Admission, patients condition and plan of care including tests being ordered have been discussed with the patient and his cousin over the phone who indicate understanding and agree with the plan and Code Status.  Code Status Full  Likely DC to  SNF  Condition GUARDED    Time spent in minutes : 65 minutes    Jasaun Carn M.D on 07/30/2014 at 4:48 PM  Between 7am to 7pm - Pager - [phone]  After 7pm go to [email or web address] - password [email or web address]  And look for the night coverage person covering me after hours  [facility] Office  [phone]   **Disclaimer: This note may have been dictated with voice recognition software. Similar sounding words can inadvertently be transcribed and this note may contain transcription errors which may not have been corrected upon publication of note.**

## 2014-07-31 ENCOUNTER — Inpatient Hospital Stay (HOSPITAL_COMMUNITY): Payer: Medicare Other

## 2014-07-31 LAB — CBC
HCT: 26.8 % — ABNORMAL LOW (ref 39.0–52.0)
HEMOGLOBIN: 8.9 g/dL — AB (ref 13.0–17.0)
MCH: 26.5 pg (ref 26.0–34.0)
MCHC: 33.2 g/dL (ref 30.0–36.0)
MCV: 79.8 fL (ref 78.0–100.0)
Platelets: 104 10*3/uL — ABNORMAL LOW (ref 150–400)
RBC: 3.36 MIL/uL — ABNORMAL LOW (ref 4.22–5.81)
RDW: 14 % (ref 11.5–15.5)
WBC: 17.8 10*3/uL — ABNORMAL HIGH (ref 4.0–10.5)

## 2014-07-31 LAB — BASIC METABOLIC PANEL
ANION GAP: 19 — AB (ref 5–15)
Anion gap: 18 — ABNORMAL HIGH (ref 5–15)
Anion gap: 17 — ABNORMAL HIGH (ref 5–15)
BUN: 65 mg/dL — AB (ref 6–23)
BUN: 68 mg/dL — ABNORMAL HIGH (ref 6–23)
BUN: 67 mg/dL — AB (ref 6–23)
CHLORIDE: 104 meq/L (ref 96–112)
CO2: 17 mEq/L — ABNORMAL LOW (ref 19–32)
CO2: 17 mEq/L — ABNORMAL LOW (ref 19–32)
CO2: 12 mEq/L — ABNORMAL LOW (ref 19–32)
CREATININE: 4.83 mg/dL — AB (ref 0.50–1.35)
Calcium: 7.9 mg/dL — ABNORMAL LOW (ref 8.4–10.5)
Calcium: 7.8 mg/dL — ABNORMAL LOW (ref 8.4–10.5)
Calcium: 8.2 mg/dL — ABNORMAL LOW (ref 8.4–10.5)
Chloride: 107 mEq/L (ref 96–112)
Chloride: 105 mEq/L (ref 96–112)
Creatinine, Ser: 4.65 mg/dL — ABNORMAL HIGH (ref 0.50–1.35)
Creatinine, Ser: 4.73 mg/dL — ABNORMAL HIGH (ref 0.50–1.35)
GFR calc Af Amer: 11 mL/min — ABNORMAL LOW (ref >90)
GFR, EST AFRICAN AMERICAN: 12 mL/min — AB (ref >90)
GFR, EST AFRICAN AMERICAN: 12 mL/min — AB (ref >90)
GFR, EST NON AFRICAN AMERICAN: 10 mL/min — AB (ref >90)
GFR, EST NON AFRICAN AMERICAN: 10 mL/min — AB (ref >90)
GFR, EST NON AFRICAN AMERICAN: 10 mL/min — AB (ref >90)
Glucose, Bld: 102 mg/dL — ABNORMAL HIGH (ref 70–99)
Glucose, Bld: 118 mg/dL — ABNORMAL HIGH (ref 70–99)
Glucose, Bld: 115 mg/dL — ABNORMAL HIGH (ref 70–99)
POTASSIUM: 3.9 meq/L (ref 3.7–5.3)
POTASSIUM: 4.3 meq/L (ref 3.7–5.3)
Potassium: 4.7 mEq/L (ref 3.7–5.3)
Sodium: 139 mEq/L (ref 137–147)
Sodium: 141 mEq/L (ref 137–147)
Sodium: 136 mEq/L — ABNORMAL LOW (ref 137–147)

## 2014-07-31 LAB — CK
Total CK: 1102 U/L — ABNORMAL HIGH (ref 7–232)
Total CK: 990 U/L — ABNORMAL HIGH (ref 7–232)
Total CK: 1160 U/L — ABNORMAL HIGH (ref 7–232)
Total CK: 1102 U/L — ABNORMAL HIGH (ref 7–232)

## 2014-07-31 LAB — GLUCOSE, CAPILLARY
GLUCOSE-CAPILLARY: 166 mg/dL — AB (ref 70–99)
Glucose-Capillary: 118 mg/dL — ABNORMAL HIGH (ref 70–99)
Glucose-Capillary: 109 mg/dL — ABNORMAL HIGH (ref 70–99)
Glucose-Capillary: 92 mg/dL (ref 70–99)

## 2014-07-31 LAB — IRON AND TIBC: UIBC: 139 ug/dL (ref 125–400)

## 2014-07-31 LAB — DIC (DISSEMINATED INTRAVASCULAR COAGULATION)PANEL
D-Dimer, Quant: 5.3 ug/mL-FEU — ABNORMAL HIGH (ref 0.00–0.48)
Fibrinogen: 644 mg/dL — ABNORMAL HIGH (ref 204–475)
INR: 1.49 (ref 0.00–1.49)
Prothrombin Time: 18.2 seconds — ABNORMAL HIGH (ref 11.6–15.2)
aPTT: 45 seconds — ABNORMAL HIGH (ref 24–37)

## 2014-07-31 LAB — DIC (DISSEMINATED INTRAVASCULAR COAGULATION) PANEL
Platelets: 84 10*3/uL — ABNORMAL LOW (ref 150–400)
Smear Review: NONE SEEN

## 2014-07-31 LAB — FERRITIN: FERRITIN: 1768 ng/mL — AB (ref 22–322)

## 2014-07-31 LAB — HEMOGLOBIN A1C
HEMOGLOBIN A1C: 5.6 % (ref <5.7)
MEAN PLASMA GLUCOSE: 114 mg/dL (ref <117)

## 2014-07-31 LAB — VITAMIN B12: Vitamin B-12: 484 pg/mL (ref 211–911)

## 2014-07-31 LAB — FOLATE: Folate: 8.8 ng/mL

## 2014-07-31 MED ORDER — SODIUM CHLORIDE 0.9 % IV BOLUS (SEPSIS)
500.0000 mL | Freq: Once | INTRAVENOUS | Status: AC
Start: 2014-07-31 — End: 2014-07-31
  Administered 2014-07-31: 500 mL via INTRAVENOUS

## 2014-07-31 MED ORDER — STERILE WATER FOR INJECTION IV SOLN
INTRAVENOUS | Status: DC
  Administered 2014-07-31 – 2014-08-02 (×3): via INTRAVENOUS
  Filled 2014-07-31 (×10): qty 850

## 2014-07-31 NOTE — Evaluation (Signed)
Occupational Therapy Evaluation Patient Details Name: Luke Johnson MRN: [id number] DOB: 1926/01/04 Today's Date: 07/31/2014    History of Present Illness Pt is an 80 y.o. male, with known past medical history of hypertension, blindness, malignant neoplasm of prostate, dyslipidemia, edema, anemia, DM type II admitted from a facility due found down on floor and hyperglycemia.   Clinical Impression   Prior to admission, pt was supervised for showering, but otherwise modified independent in mobility and ADL and living in ALF. Pt presents this visit with lethargy and requires mod assist for mobility and mod to max for ADL.  Will follow acutely.    Follow Up Recommendations  Supervision/Assistance - 24 hour (ALF if staff able to provide appropriate level of assist)    Equipment Recommendations       Recommendations for Other Services       Precautions / Restrictions Precautions Precautions: Fall Precaution Comments: legally blind      Mobility Bed Mobility Overal bed mobility: Needs Assistance Bed Mobility: Supine to Sit     Supine to sit: Mod assist     General bed mobility comments: pt able to assist with initiating bed mobility with verbal and tactile cues, assist for trunk upright  Transfers Overall transfer level: Needs assistance Equipment used: Rolling walker (2 wheeled) Transfers: Sit to/from Stand Sit to Stand: Mod assist         General transfer comment: verbal and tactile cues for technique    Balance Overall balance assessment: Needs assistance         Standing balance support: Bilateral upper extremity supported Standing balance-Leahy Scale: Fair                            ADL Overall ADL's : Needs assistance/impaired Eating/Feeding: Maximal assistance;Sitting   Grooming: Wash/dry hands;Supervision/safety;Sitting                               Functional mobility during ADLs: Moderate assistance General ADL  Comments: Pt with lethargy limiting ability to participate.     Vision                     Perception     Praxis      Pertinent Vitals/Pain Pain Assessment: No/denies pain     Hand Dominance Right   Extremity/Trunk Assessment Upper Extremity Assessment Upper Extremity Assessment: Overall WFL for tasks assessed   Lower Extremity Assessment Lower Extremity Assessment: Defer to PT evaluation       Communication Communication Communication: No difficulties   Cognition Arousal/Alertness: Lethargic;Suspect due to medications Behavior During Therapy: [facility] for tasks assessed/performed Overall Cognitive Status: No family/caregiver present to determine baseline cognitive functioning (known baseline dementia)                     General Comments       Exercises       Shoulder Instructions      Home Living Family/patient expects to be discharged to:: Assisted living ([facility])                             Home Equipment: Kasandra Knudsen - single point          Prior Functioning/Environment Level of Independence: Needs assistance  Gait / Transfers Assistance Needed: ambulated with cane throughout ALF, counted steps ADL's /  Homemaking Assistance Needed: Supervised for bathing, able to dress and toilet independently.  Self fed with verbal cues for set up.   Comments: Hx obtained from staff at [facility].    OT Diagnosis: Generalized weakness;Cognitive deficits;Blindness and low vision   OT Problem List: Decreased strength;Decreased activity tolerance;Impaired balance (sitting and/or standing);Impaired vision/perception;Decreased cognition;Decreased safety awareness;Decreased knowledge of use of DME or AE   OT Treatment/Interventions: Self-care/ADL training;DME and/or AE instruction;Patient/family education;Balance training;Therapeutic activities    OT Goals(Current goals can be found in the care plan section) Acute Rehab OT Goals Patient  Stated Goal: did not state OT Goal Formulation: With patient Time For Goal Achievement: 08/14/14 Potential to Achieve Goals: Good  OT Frequency: Min 2X/week   Barriers to D/C:            Co-evaluation              End of Session    Activity Tolerance: Patient limited by lethargy Patient left: in bed;with call bell/phone within reach   Time: 1203-1225 OT Time Calculation (min): 22 min Charges:  OT General Charges $OT Visit: 1 Procedure OT Evaluation $Initial OT Evaluation Tier I: 1 Procedure OT Treatments $Self Care/Home Management : 8-22 mins G-Codes:    Malka So 07/31/2014, 12:37 PM [phone]

## 2014-07-31 NOTE — Evaluation (Signed)
Physical Therapy Evaluation Patient Details Name: JEFFREE CAZEAU MRN: [id number] DOB: 07/08/1926 Today's Date: 07/31/2014   History of Present Illness  Pt is an 80 y.o. male, with known past medical history of hypertension, blindness, malignant neoplasm of prostate, dyslipidemia, edema, anemia, DM type II admitted from a facility due found down on floor and hyperglycemia.  Clinical Impression  Pt admitted after fall at facility. Pt currently with functional limitations due to the deficits listed below (see PT Problem List).  Pt will benefit from skilled PT to increase their independence and safety with mobility to allow discharge to the venue listed below.  Pt poor historian and no CM or CSW notes in yet, so recommend at least min assist available for mobility upon d/c otherwise may need SNF (uncertain if pt from ALF or SNF).  Pt did well with following verbal and tactile cues to assist with ambulating short distance in hallway.     Follow Up Recommendations Supervision/Assistance - 24 hour;Home health PT;SNF (if from ALF, would need at least min assist otherwise recommend SNF)    Equipment Recommendations  Rolling walker with 5" wheels    Recommendations for Other Services       Precautions / Restrictions Precautions Precautions: Fall Precaution Comments: legally blind      Mobility  Bed Mobility Overal bed mobility: Needs Assistance Bed Mobility: Supine to Sit     Supine to sit: Mod assist     General bed mobility comments: pt able to assist with initiating bed mobility with verbal and tactile cues, assist for trunk upright  Transfers Overall transfer level: Needs assistance Equipment used: Rolling walker (2 wheeled) Transfers: Sit to/from Stand Sit to Stand: Min assist         General transfer comment: verbal and tactile cues for technique  Ambulation/Gait Ambulation/Gait assistance: Min assist;Mod assist Ambulation Distance (Feet): 60 Feet Assistive device:  Rolling walker (2 wheeled) Gait Pattern/deviations: Shuffle;Narrow base of support;Decreased stride length;Step-through pattern Gait velocity: decr   General Gait Details: assist to steady and negotiate RW for pt due to blindness  Stairs            Wheelchair Mobility    Modified Rankin (Stroke Patients Only)       Balance Overall balance assessment: Needs assistance         Standing balance support: Bilateral upper extremity supported Standing balance-Leahy Scale: Fair Standing balance comment: pt able to stand with RW for support min/guard while performing pericare and hygiene (pt found in soft BM in bed on arrival)                             Pertinent Vitals/Pain Pain Assessment: No/denies pain    Home Living Family/patient expects to be discharged to:: Assisted living               Home Equipment: Kasandra Knudsen - single point      Prior Function Level of Independence: Independent with assistive device(s)         Comments: pt poor historian, per chart review, ambulatory with SPC at facility     Hand Dominance        Extremity/Trunk Assessment               Lower Extremity Assessment: Generalized weakness         Communication      Cognition Arousal/Alertness: Awake/alert Behavior During Therapy: WFL for tasks assessed/performed Overall Cognitive Status: No family/caregiver  present to determine baseline cognitive functioning                      General Comments      Exercises        Assessment/Plan    PT Assessment Patient needs continued PT services  PT Diagnosis Generalized weakness;Difficulty walking   PT Problem List Decreased strength;Decreased activity tolerance;Decreased mobility;Decreased knowledge of use of DME  PT Treatment Interventions DME instruction;Gait training;Functional mobility training;Patient/family education;Therapeutic activities;Therapeutic exercise;Balance training   PT Goals  (Current goals can be found in the Care Plan section) Acute Rehab PT Goals PT Goal Formulation: With patient Time For Goal Achievement: 08/07/14 Potential to Achieve Goals: Good    Frequency Min 3X/week   Barriers to discharge        Co-evaluation               End of Session   Activity Tolerance: Patient tolerated treatment well Patient left: in chair;with call bell/phone within reach;with chair alarm set           Time: 0569-7948 PT Time Calculation (min): 23 min   Charges:   PT Evaluation $Initial PT Evaluation Tier I: 1 Procedure PT Treatments $Gait Training: 8-22 mins   PT G Codes:          Dominigue Gellner,KATHrine E 07/31/2014, 11:52 AM Carmelia Bake, PT, DPT 07/31/2014 Pager: [phone]

## 2014-07-31 NOTE — Progress Notes (Addendum)
Clinical Social Work Department CLINICAL SOCIAL WORK PLACEMENT NOTE 07/31/2014  Patient:  Luke Johnson, Luke Johnson  Account Number:  [id number] Admit date:  07/30/2014  Clinical Social Worker:  Renold Genta  Date/time:  07/31/2014 03:56 PM  Clinical Social Work is seeking post-discharge placement for this patient at the following level of care:   SKILLED NURSING   (*CSW will update this form in Epic as items are completed)   07/31/2014 Patient/family provided with [place] Department of Clinical Social Work's list of facilities offering this level of care within the geographic area requested by the patient (or if unable, by the patient's family).  07/31/2014  Patient/family informed of their freedom to choose among providers that offer the needed level of care, that participate in Medicare, Medicaid or managed care program needed by the patient, have an available bed and are willing to accept the patient.  07/31/2014  Patient/family informed of [facility]' ownership interest in [facility], as well as of the fact that they are under no obligation to receive care at this facility.  PASARR submitted to EDS on 07/31/2014 PASARR number received on 07/31/2014  FL2 transmitted to all facilities in geographic area requested by pt/family on  07/31/2014 & 08/07/2014 FL2 transmitted to all facilities within larger geographic area on   Patient informed that his/her managed care company has contracts with or will negotiate with  certain facilities, including the following:     Patient/family informed of bed offers received:  08/07/2014 Patient chooses bed at [facility] Physician recommends and patient chooses bed at    Patient to be transferred to  on   Patient to be transferred to facility by  Patient and family notified of transfer on  Name of family member notified:    The following physician request were entered in Epic:   Additional Comments:   Raynaldo Opitz, [place] Social Worker cell #: [phone]

## 2014-07-31 NOTE — Progress Notes (Signed)
Clinical Social Work Department BRIEF PSYCHOSOCIAL ASSESSMENT 07/31/2014  Patient:  Luke Johnson, Luke Johnson     Account Number:  [id number]     Admit date:  07/30/2014  Clinical Social Worker:  Renold Genta  Date/Time:  07/31/2014 03:52 PM  Referred by:  Physician  Date Referred:  07/31/2014 Referred for  Other - See comment   Other Referral:   Admitted from: [facility]   Interview type:  Family Other interview type:   patient's cousin, Luke Johnson via phone    PSYCHOSOCIAL DATA Living Status:  FACILITY Admitted from facility:  [place] Level of care:  Assisted Living Primary support name:  Luke Johnson (cousin) h#: [id number] c#: [phone] Primary support relationship to patient:  FAMILY Degree of support available:   good    CURRENT CONCERNS Current Concerns  Post-Acute Placement   Other Concerns:    SOCIAL WORK ASSESSMENT / PLAN CSW received consult that patient was admitted from [facility].   Assessment/plan status:  Information/Referral to [employer] Other assessment/ plan:   Information/referral to community resources:   CSW completed FL2 and faxed information to [facility], will follow-up Monday re: return.    PATIENT'S/FAMILY'S RESPONSE TO PLAN OF CARE: Patient's cousin, Luke Johnson informed CSW that patient has been living at [facility] since 2003 and he's been blind for about 50 years. Cousin states that he would prefer that he return to ALF but understands that if they recommend that he go to SNF before returning that he would be ok with that. Cousin agreed to have information faxed out to [facility] as a backup to returning to [facility].         Luke Johnson, [facility] Clinical Social Worker cell #: [phone]

## 2014-07-31 NOTE — Progress Notes (Signed)
Patient Demographics  Luke Johnson, is a 80 y.o. male, DOB - [birth year], YIR:[id number]  Admit date - 07/30/2014   Admitting Physician Phillips Climes, MD  Outpatient Primary MD for the patient is [name], MD  LOS - 1   Chief Complaint  Patient presents with  . Hyperglycemia  . Fall    brief narrative:  Luke Johnson is a 80 y.o. male, with known past medical history of hypertension, blindness, malignant neoplasm of prostate, dyslipidemia, edema, anemia, represents from skilled nursing facility as he was found on the floor, patient is a poor historian, can't provide any reliable history, usually he is oriented active, CT head,/CT cervical spine, did not show any acute finding or any.  Patient workup was significant for UTI, will leukocytosis of 17,000 ,started on Rocephin in ED, as well patient was found to be in acute on chronic renal failure last known creatinine 1.9, creatinine was 4.4 on admission, no urinary retention 150 cc on bladder scan. Patient had elevated CK, secondary to Rhabdo, continues to have worsening a creatinine, despite aggressive hydration, renal were consulted.   Subjective:   Luke Johnson today has, No headache, No chest pain, No abdominal pain - No Nausea, No new weakness tingling or numbness, No Cough - SOB.  Assessment & Plan    Active Problems:   Acute on chronic renal failure   Acute encephalopathy   UTI (lower urinary tract infection)   Prostate cancer   HTN (hypertension)   Dyslipidemia   Hyperglycemia   Anemia  acute on chronic renal failure  -Baseline creatinine 1.9, today is 4.8  -Most likely related to Rhabdo ,volume depletion and dehydration, no evidence of urinary retention at this point.-Will continue with aggressive IV hydration,  will check renal ultrasound.  -Will continue to hold Lasix  -Nephrology  consulted. Acute encephalopathy  -Metabolic encephalopathy secondary to his UTI, worsening renal failure, no acute finding in CT head, no focal neurological deficit  UTI  -Continue with Rocephin, follow with urine culture  Hypertension  -Will hold all meds as blood pressure is on the lower side  Hyperglycemia  -Patient was noticed to have hyperglycemia at the skilled nursing facility. -There is no official diagnosis of diabetes mellitus, hemoglobin A1c is 5.6, is on insulin sliding scale, but so far his glucose level are acceptable, if remains stable with this he sliding scale by tomorrow. Hyperlipidemia  -Will hold statin due to Rhabdomyolysis . Prostate cancer  -Continue with Casodex  Code Status: Full  Family Communication: Spoke with his cousin 10/60  Disposition Plan: SNF when stable   Procedures  None   Consults   Renal   Medications  Scheduled Meds: . antiseptic oral rinse  15 mL Mouth Rinse BID  . bicalutamide  50 mg Oral Daily  . cefTRIAXone (ROCEPHIN)  IV  1 g Intravenous Q24H  . cyanocobalamin  500 mcg Oral Daily  . ferrous fumarate  106 mg of iron Oral QPC breakfast  . heparin  5,000 Units Subcutaneous 3 times per day  . insulin aspart  0-9 Units Subcutaneous TID WC  . senna  1 tablet Oral BID   Continuous Infusions: . sodium chloride 75 mL/hr at 07/31/14 0017   PRN Meds:.acetaminophen, acetaminophen, albuterol, bisacodyl, ondansetron (  ZOFRAN) IV, ondansetron, polyethylene glycol  DVT Prophylaxis  Heparin - SCDs   Lab Results  Component Value Date   PLT 104* 07/31/2014    Antibiotics    Anti-infectives   Start     Dose/Rate Route Frequency Ordered Stop   07/31/14 1400  cefTRIAXone (ROCEPHIN) 1 g in dextrose 5 % 50 mL IVPB     1 g 100 mL/hr over 30 Minutes Intravenous Every 24 hours 07/30/14 2138     07/30/14 1415  cefTRIAXone (ROCEPHIN) 1 g in dextrose 5 % 50 mL IVPB     1 g 100 mL/hr over 30 Minutes Intravenous  Once 07/30/14 1403 07/30/14  1505          Objective:   Filed Vitals:   07/30/14 2208 07/31/14 0700 07/31/14 0750 07/31/14 0824  BP: 80/36 111/47  122/49  Pulse:  84  81  Temp:  98.9 F (37.2 C)    TempSrc:  Oral    Resp:  18 36   SpO2:  97%  97%    Wt Readings from Last 3 Encounters:  No data found for Wt     Intake/Output Summary (Last 24 hours) at 07/31/14 1304 Last data filed at 07/31/14 0825  Gross per 24 hour  Intake 1489.17 ml  Output   2000 ml  Net -510.83 ml     Physical Exam  Awake Alert, Oriented X 2, No new F.N deficits, Normal affect [place].AT, legally blind, with total opacification of cornea Supple Neck,No JVD, No cervical lymphadenopathy appriciated.  Symmetrical Chest wall movement, Good air movement bilaterally, CTAB RRR,No Gallops,Rubs or new Murmurs, No Parasternal Heave +ve B.Sounds, Abd Soft, No tenderness, No organomegaly appriciated, No rebound - guarding or rigidity. No Cyanosis, Clubbing or edema, No new Rash or bruise     Data Review   Micro Results Recent Results (from the past 240 hour(s))  MRSA PCR SCREENING     Status: None   Collection Time    07/30/14 10:11 PM      Result Value Ref Range Status   MRSA by PCR NEGATIVE  NEGATIVE Final   Comment:            The GeneXpert MRSA Assay (FDA     approved for NASAL specimens     only), is one component of a     comprehensive MRSA colonization     surveillance program. It is not     intended to diagnose MRSA     infection nor to guide or     monitor treatment for     MRSA infections.    Radiology Reports Dg Chest 2 View  07/30/2014   CLINICAL DATA:  Found down on the floor. Hyperglycemia with history of diabetes.  EXAM: CHEST  2 VIEW  COMPARISON:  12/2009  FINDINGS: Cardiac silhouette is upper limits of normal to mildly enlarged in size. Large hiatal hernia is again seen. The lungs are clear. No pleural effusion or pneumothorax is identified. No acute osseous abnormality is seen.  IMPRESSION: No active  cardiopulmonary disease.  Hiatal hernia.   Electronically Signed   By: Logan Bores   On: 07/30/2014 14:44   Ct Head Wo Contrast  07/30/2014   CLINICAL DATA:  Initial evaluation for confusion, patient states that he fell but cannot remember within where or hila  EXAM: CT HEAD WITHOUT CONTRAST  CT CERVICAL SPINE WITHOUT CONTRAST  TECHNIQUE: Multidetector CT imaging of the head and cervical spine was performed following the standard  protocol without intravenous contrast. Multiplanar CT image reconstructions of the cervical spine were also generated.  COMPARISON:  06/28/2012  FINDINGS: CT HEAD FINDINGS  Moderate to severe atrophy. Mild low attenuation in the deep white matter. No hemorrhage or extra-axial fluid. No evidence of mass or vascular territory infarct. No hydrocephalus. Calvarium is intact.  CT CERVICAL SPINE FINDINGS  Multifocal intravenous air in the right neck likely due to IV access and injection. No other soft tissue abnormalities.  No fracture identified. No paraspinous hematoma. Normal anterior-posterior alignment. Multilevel degenerative disc disease. Degenerative change also involves posterior elements. Fusion of the posterior elements at C4 and C5 may be congenital.  IMPRESSION: No acute intracranial abnormality. No evidence of acute traumatic injury involving the cervical spine.   Electronically Signed   By: Skipper Cliche M.D.   On: 07/30/2014 14:42   Ct Cervical Spine Wo Contrast  07/30/2014   CLINICAL DATA:  Initial evaluation for confusion, patient states that he fell but cannot remember within where or hila  EXAM: CT HEAD WITHOUT CONTRAST  CT CERVICAL SPINE WITHOUT CONTRAST  TECHNIQUE: Multidetector CT imaging of the head and cervical spine was performed following the standard protocol without intravenous contrast. Multiplanar CT image reconstructions of the cervical spine were also generated.  COMPARISON:  06/28/2012  FINDINGS: CT HEAD FINDINGS  Moderate to severe atrophy. Mild low  attenuation in the deep white matter. No hemorrhage or extra-axial fluid. No evidence of mass or vascular territory infarct. No hydrocephalus. Calvarium is intact.  CT CERVICAL SPINE FINDINGS  Multifocal intravenous air in the right neck likely due to IV access and injection. No other soft tissue abnormalities.  No fracture identified. No paraspinous hematoma. Normal anterior-posterior alignment. Multilevel degenerative disc disease. Degenerative change also involves posterior elements. Fusion of the posterior elements at C4 and C5 may be congenital.  IMPRESSION: No acute intracranial abnormality. No evidence of acute traumatic injury involving the cervical spine.   Electronically Signed   By: Skipper Cliche M.D.   On: 07/30/2014 14:42    CBC  Recent Labs Lab 07/30/14 1337 07/31/14 0250  WBC 17.7* 17.8*  HGB 9.4* 8.9*  HCT 28.9* 26.8*  PLT 146* 104*  MCV 80.3 79.8  MCH 26.1 26.5  MCHC 32.5 33.2  RDW 14.0 14.0    Chemistries   Recent Labs Lab 07/30/14 1337 07/30/14 2250 07/31/14 0250 07/31/14 0649 07/31/14 1044  NA 136* 139 139 141 136*  K 4.8 3.7 3.9 4.3 4.7  CL 99 103 104 107 105  CO2 21 18* 17* 17* 12*  GLUCOSE 231* 122* 102* 118* 115*  BUN 60* 66* 65* 68* 67*  CREATININE 4.41* 4.69* 4.65* 4.83* 4.73*  CALCIUM 8.4 8.1* 7.9* 7.8* 8.2*  AST 35  --   --   --   --   ALT 20  --   --   --   --   ALKPHOS 81  --   --   --   --   BILITOT 0.4  --   --   --   --    ------------------------------------------------------------------------------------------------------------------ CrCl is unknown because there is no height on file for the current visit. ------------------------------------------------------------------------------------------------------------------  Recent Labs  07/30/14 2037  HGBA1C 5.6   ------------------------------------------------------------------------------------------------------------------ No results found for this basename: CHOL, HDL, LDLCALC,  TRIG, CHOLHDL, LDLDIRECT,  in the last 72 hours ------------------------------------------------------------------------------------------------------------------ No results found for this basename: TSH, T4TOTAL, FREET3, T3FREE, THYROIDAB,  in the last 72 hours ------------------------------------------------------------------------------------------------------------------  Recent Labs  07/30/14 2037  VITAMINB12 484  FOLATE 8.8  FERRITIN 1768*  TIBC Not calculated due to Iron <10.  IRON <10*  RETICCTPCT 1.0    Coagulation profile No results found for this basename: INR, PROTIME,  in the last 168 hours  No results found for this basename: DDIMER,  in the last 72 hours  Cardiac Enzymes  Recent Labs Lab 07/30/14 1353 07/30/14 1522  CKMB  --  4.7*  TROPONINI <0.30  --    ------------------------------------------------------------------------------------------------------------------ No components found with this basename: POCBNP,      Time Spent in minutes   30 minutes   Rayla Pember M.D on 07/31/2014 at 1:04 PM  Between 7am to 7pm - Pager - [phone]  After 7pm go to [email or web address] - password [email or web address]  And look for the night coverage person covering for me after hours  [facility] Office  [phone]   **Disclaimer: This note may have been dictated with voice recognition software. Similar sounding words can inadvertently be transcribed and this note may contain transcription errors which may not have been corrected upon publication of note.**

## 2014-07-31 NOTE — Consult Note (Signed)
Reason for Consult:AKI Referring Physician: Waldron Labs, MD  Luke Johnson is an 79 y.o. male.  HPI: Pt is an 80yo AAM with PMH sig for DM, blindness, HTN, prostate CA, and CKD who was found down at his NH with AMS and was brought to [facility] where he was found to have a Scr of 4.41, pyuria concerning for UTI, with leukocytosis of 17,000 (started on Rocephin in ED), anemia, and hypotension (BP 80/36).  Pt was admitted and continued on abx as well as IVF's.  Renal US showed mild to moderate left hydronephrosis and evidence of stones as well as an elevated CPK of 1100.  We were asked to see the patient to further evaluation and management of his progressive kidney disease despite IVF's.  Of note, the admitting physician contacted the NH staff and reported that the pt is normally alert and oriented, however he has not been "himself" for the last 2 days and was found on the floor after an unknown period of time.  Also there is no documentation of diabetes on his records although his CBG was >300 in the ED.  He also had some urinary retention on bladder scan and staff retrieved 200 cc's of urine after foley catheter was placed.   Trend in Creatinine: Creatinine, Ser  Date/Time Value Ref Range Status  07/31/2014 10:44 AM 4.73* 0.50 - 1.35 mg/dL Final  07/31/2014  6:49 AM 4.83* 0.50 - 1.35 mg/dL Final  07/31/2014  2:50 AM 4.65* 0.50 - 1.35 mg/dL Final  07/30/2014 10:50 PM 4.69* 0.50 - 1.35 mg/dL Final  07/30/2014  1:37 PM 4.41* 0.50 - 1.35 mg/dL Final  06/28/2012  4:01 PM 1.90* 0.50 - 1.35 mg/dL Final  06/27/2012  7:01 PM 1.90* 0.50 - 1.35 mg/dL Final  01/09/2010  7:05 PM 2.34* 0.4 - 1.5 mg/dL Final  10/10/2008  1:49 PM 1.9* 0.4 - 1.5 mg/dL Final  05/28/2008 11:00 AM 1.9*  Final  06/14/2007  3:11 PM 2.0*  Final    PMH:   Past Medical History  Diagnosis Date  . 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  Chronic kidney disease, stage III (moderate)   . Legal blindness, as defined in [place]   . Carcinoma in situ of prostate   . Anemia, unspecified   . Type II or unspecified type diabetes mellitus with renal manifestations, not stated as uncontrolled   . Other and unspecified hyperlipidemia   . Edema   . Obesity, unspecified   . Diabetes mellitus   . Dyslipidemia     PSH:   Past Surgical History  Procedure Laterality Date  . No past surgeries      Allergies: No Known Allergies  Medications:   Prior to Admission medications   Medication Sig Start Date End Date Taking? Authorizing Provider  aspirin EC 81 MG tablet Take 81 mg by mouth every other day.    Yes Historical Provider, MD  bicalutamide (CASODEX) 50 MG tablet Take 50 mg by mouth daily.   Yes Historical Provider, MD  cyanocobalamin 500 MCG tablet Take 500 mcg by mouth daily.   Yes Historical Provider, MD  diltiazem (CARDIZEM CD) 120 MG 24 hr capsule Take 120 mg by mouth daily.   Yes Historical Provider, MD  ferrous fumarate (FERRO-SEQUELS) 50 MG CR tablet Take 200 mg by mouth daily after breakfast.    Yes Historical Provider, MD  furosemide (LASIX) 20 MG tablet  Take 20 mg by mouth daily.    Yes Historical Provider, MD  hydrALAZINE (APRESOLINE) 50 MG tablet Take 50 mg by mouth 2 (two) times daily.   Yes Historical Provider, MD  Multiple Vitamin (MULTIVITAMIN WITH MINERALS) TABS Take 1 tablet by mouth daily.   Yes Historical Provider, MD  sennosides-docusate sodium (SENOKOT-S) 8.6-50 MG tablet Take 1 tablet by mouth daily.   Yes Historical Provider, MD  simvastatin (ZOCOR) 20 MG tablet Take 20 mg by mouth at bedtime.   Yes Historical Provider, MD  Skin Protectants, Misc. (EUCERIN) cream Apply 1 application topically 2 (two) times daily. Apply to affected areas as directed.   Yes Historical Provider, MD    Inpatient medications: . antiseptic oral rinse  15 mL Mouth Rinse BID  . bicalutamide  50 mg Oral Daily  . cefTRIAXone  (ROCEPHIN)  IV  1 g Intravenous Q24H  . cyanocobalamin  500 mcg Oral Daily  . ferrous fumarate  106 mg of iron Oral QPC breakfast  . heparin  5,000 Units Subcutaneous 3 times per day  . insulin aspart  0-9 Units Subcutaneous TID WC  . senna  1 tablet Oral BID    Discontinued Meds:   Medications Discontinued During This Encounter  Medication Reason  . indapamide (LOZOL) 1.25 MG tablet Entry Error    Social History:  reports that he has quit smoking. He has never used smokeless tobacco. He reports that he does not drink alcohol or use illicit drugs.  Family History:  History reviewed. No pertinent family history.  Review of systems not obtained due to patient factors. Weight change:   Intake/Output Summary (Last 24 hours) at 07/31/14 1356 Last data filed at 07/31/14 0825  Gross per 24 hour  Intake 1489.17 ml  Output   2000 ml  Net -510.83 ml   BP 122/49  Pulse 81  Temp(Src) 98.9 F (37.2 C) (Oral)  Resp 36  SpO2 97% Filed Vitals:   07/30/14 2208 07/31/14 0700 07/31/14 0750 07/31/14 0824  BP: 80/36 111/47  122/49  Pulse:  84  81  Temp:  98.9 F (37.2 C)    TempSrc:  Oral    Resp:  18 36   SpO2:  97%  97%     General appearance: frail, cachectic AAM who is somnolent and minimally responsive/arousable Head: atraumatic, +bitemporal wasting Eyes: sclerotic cornea bilaterally Neck: no carotid bruit, no JVD, supple, symmetrical, trachea midline and thyroid not enlarged, symmetric, no tenderness/mass/nodules Resp: occ rhonchi Cardio: regular rate and rhythm and no rub GI: soft, non-tender; bowel sounds normal; no masses,  no organomegaly Extremities: no edema  Labs: Basic Metabolic Panel:  Recent Labs Lab 07/30/14 1337 07/30/14 2250 07/31/14 0250 07/31/14 0649 07/31/14 1044  NA 136* 139 139 141 136*  K 4.8 3.7 3.9 4.3 4.7  CL 99 103 104 107 105  CO2 21 18* 17* 17* 12*  GLUCOSE 231* 122* 102* 118* 115*  BUN 60* 66* 65* 68* 67*  CREATININE 4.41* 4.69* 4.65*  4.83* 4.73*  ALBUMIN 2.9*  --   --   --   --   CALCIUM 8.4 8.1* 7.9* 7.8* 8.2*   Liver Function Tests:  Recent Labs Lab 07/30/14 1337  AST 35  ALT 20  ALKPHOS 81  BILITOT 0.4  PROT 6.9  ALBUMIN 2.9*   No results found for this basename: LIPASE, AMYLASE,  in the last 168 hours No results found for this basename: AMMONIA,  in the last 168 hours CBC:  Recent  Labs Lab 07/30/14 1337 07/31/14 0250  WBC 17.7* 17.8*  HGB 9.4* 8.9*  HCT 28.9* 26.8*  MCV 80.3 79.8  PLT 146* 104*   PT/INR: $Remove'@[email or web address]'LmICdoi$ (inr:5) Cardiac Enzymes: ) Recent Labs Lab 07/30/14 1353  07/30/14 1522 07/30/14 2037 07/30/14 2250 07/31/14 0250 07/31/14 0649 07/31/14 1044  CKTOTAL  --   < > 754* 1152* 1215* 1102* 990* 1160*  CKMB  --   --  4.7*  --   --   --   --   --   TROPONINI <0.30  --   --   --   --   --   --   --   < > = values in this interval not displayed. CBG:  Recent Labs Lab 07/30/14 1259 07/30/14 2126 07/31/14 0756 07/31/14 1213  GLUCAP 238* 126* 118* 166*    Iron Studies:  Recent Labs Lab 07/30/14 2037  IRON <10*  TIBC Not calculated due to Iron <10.  FERRITIN 1768*    Xrays/Other Studies: Dg Chest 2 View  07/30/2014   CLINICAL DATA:  Found down on the floor. Hyperglycemia with history of diabetes.  EXAM: CHEST  2 VIEW  COMPARISON:  12/2009  FINDINGS: Cardiac silhouette is upper limits of normal to mildly enlarged in size. Large hiatal hernia is again seen. The lungs are clear. No pleural effusion or pneumothorax is identified. No acute osseous abnormality is seen.  IMPRESSION: No active cardiopulmonary disease.  Hiatal hernia.   Electronically Signed   By: Logan Bores   On: 07/30/2014 14:44   Ct Head Wo Contrast  07/30/2014   CLINICAL DATA:  Initial evaluation for confusion, patient states that he fell but cannot remember within where or hila  EXAM: CT HEAD WITHOUT CONTRAST  CT CERVICAL SPINE WITHOUT CONTRAST  TECHNIQUE: Multidetector CT imaging of the head and cervical  spine was performed following the standard protocol without intravenous contrast. Multiplanar CT image reconstructions of the cervical spine were also generated.  COMPARISON:  06/28/2012  FINDINGS: CT HEAD FINDINGS  Moderate to severe atrophy. Mild low attenuation in the deep white matter. No hemorrhage or extra-axial fluid. No evidence of mass or vascular territory infarct. No hydrocephalus. Calvarium is intact.  CT CERVICAL SPINE FINDINGS  Multifocal intravenous air in the right neck likely due to IV access and injection. No other soft tissue abnormalities.  No fracture identified. No paraspinous hematoma. Normal anterior-posterior alignment. Multilevel degenerative disc disease. Degenerative change also involves posterior elements. Fusion of the posterior elements at C4 and C5 may be congenital.  IMPRESSION: No acute intracranial abnormality. No evidence of acute traumatic injury involving the cervical spine.   Electronically Signed   By: Skipper Cliche M.D.   On: 07/30/2014 14:42   Ct Cervical Spine Wo Contrast  07/30/2014   CLINICAL DATA:  Initial evaluation for confusion, patient states that he fell but cannot remember within where or hila  EXAM: CT HEAD WITHOUT CONTRAST  CT CERVICAL SPINE WITHOUT CONTRAST  TECHNIQUE: Multidetector CT imaging of the head and cervical spine was performed following the standard protocol without intravenous contrast. Multiplanar CT image reconstructions of the cervical spine were also generated.  COMPARISON:  06/28/2012  FINDINGS: CT HEAD FINDINGS  Moderate to severe atrophy. Mild low attenuation in the deep white matter. No hemorrhage or extra-axial fluid. No evidence of mass or vascular territory infarct. No hydrocephalus. Calvarium is intact.  CT CERVICAL SPINE FINDINGS  Multifocal intravenous air in the right neck likely due to IV access and injection. No  other soft tissue abnormalities.  No fracture identified. No paraspinous hematoma. Normal anterior-posterior  alignment. Multilevel degenerative disc disease. Degenerative change also involves posterior elements. Fusion of the posterior elements at C4 and C5 may be congenital.  IMPRESSION: No acute intracranial abnormality. No evidence of acute traumatic injury involving the cervical spine.   Electronically Signed   By: Skipper Cliche M.D.   On: 07/30/2014 14:42   US Renal  07/31/2014   CLINICAL DATA:  Renal failure.  EXAM: RENAL/URINARY TRACT ULTRASOUND COMPLETE  COMPARISON:  CT abdomen and pelvis 09/28/2010.  FINDINGS: Right Kidney:  Length: 8.5 cm. Cortical echogenicity appears increased. No hydronephrosis is identified. 0.8 cm cyst is incidentally noted.  Left Kidney:  Length: 11.6 cm. 1.2 cm echogenic focus with posterior shadowing compatible with a stone is seen the lower pole. Multiple additional calcifications are seen in the left kidney measuring up to 2.0 cm. There is mild to moderate left hydronephrosis is identified.  Bladder:  Decompressed with a Foley catheter in place.  Small bilateral pleural effusions are identified.  IMPRESSION: Mild to moderate left hydronephrosis with multiple nonobstructing stone seen in the left kidney. Cause for hydronephrosis is not identified.  Cortical thinning of the right kidney with increased echogenicity consistent with medical renal disease.  Small bilateral pleural effusions.   Electronically Signed   By: Inge Rise M.D.   On: 07/31/2014 13:02     Assessment/Plan: 1.  AKI/CKD vs. Progressive CKD- pt has not had labs for review in 2 years and now with mild rhabdo and obstruction from nephrolithiasis.   1. Cont with IVF's but will change to isotonic bicarb in setting of met acidosis and mild rhabdo 2. Need to determine if he has received any abx recently or NSAIDs/cox-II I's or if there is a more recent Scr available for review 3. Consult urology due to left hydro and stones 4. No indication for dialysis and pt would be a poor candidate for longterm HD   2. rhabdo- will change to isotonic bicarb IVF's and follow, stop statin 3. Metabolic acidosis- secondary to #1 1. Isotonic bicarb IV as above 4. Obstructive uropathy- likely due to nephrolithiasis but also had some urinary retention (200cc's in bladder when foley placed earlier today) 1. Consult urology  5. Anemia- normocytic.  ?due to [facility], iron deficiency, or malignancy.  Will check SPEP/UPEP, iron stores, and may require ESA. 6. Thrombocytopenia- in light of possible UTI, ARF, and AMS need to r/o schistocytes. Likely related to early sepsis but will order DIC panel as well. 7. Pyuria- await cx and sensitivity.  On abx per primary svc 8. SIRS- hypotensive on arrival with soft BP's since. Cont with IVF's and abx 9. DM- new diagnosis, management per primary svc 10. HTN- hold meds 11. Severe protein and caloric malnutrition- per primary svc   Molleigh Huot A 07/31/2014, 1:56 PM

## 2014-08-01 ENCOUNTER — Inpatient Hospital Stay (HOSPITAL_COMMUNITY): Payer: Medicare Other

## 2014-08-01 DIAGNOSIS — D631 Anemia in chronic kidney disease: Secondary | ICD-10-CM

## 2014-08-01 LAB — CBC
HEMATOCRIT: 26.2 % — AB (ref 39.0–52.0)
HEMOGLOBIN: 8.8 g/dL — AB (ref 13.0–17.0)
MCH: 26.3 pg (ref 26.0–34.0)
MCHC: 33.6 g/dL (ref 30.0–36.0)
MCV: 78.4 fL (ref 78.0–100.0)
Platelets: 64 10*3/uL — ABNORMAL LOW (ref 150–400)
RBC: 3.34 MIL/uL — ABNORMAL LOW (ref 4.22–5.81)
RDW: 14.1 % (ref 11.5–15.5)
WBC: 16.9 10*3/uL — AB (ref 4.0–10.5)

## 2014-08-01 LAB — URINE CULTURE: Colony Count: >=100000

## 2014-08-01 LAB — RENAL FUNCTION PANEL
ALBUMIN: 2 g/dL — AB (ref 3.5–5.2)
ANION GAP: 16 — AB (ref 5–15)
BUN: 68 mg/dL — AB (ref 6–23)
CHLORIDE: 108 meq/L (ref 96–112)
CO2: 22 mEq/L (ref 19–32)
Calcium: 7.8 mg/dL — ABNORMAL LOW (ref 8.4–10.5)
Creatinine, Ser: 3.94 mg/dL — ABNORMAL HIGH (ref 0.50–1.35)
GFR calc non Af Amer: 13 mL/min — ABNORMAL LOW (ref >90)
GFR, EST AFRICAN AMERICAN: 15 mL/min — AB (ref >90)
GLUCOSE: 92 mg/dL (ref 70–99)
POTASSIUM: 3.9 meq/L (ref 3.7–5.3)
Phosphorus: 4 mg/dL (ref 2.3–4.6)
Sodium: 146 mEq/L (ref 137–147)

## 2014-08-01 LAB — GLUCOSE, CAPILLARY
Glucose-Capillary: 102 mg/dL — ABNORMAL HIGH (ref 70–99)
Glucose-Capillary: 121 mg/dL — ABNORMAL HIGH (ref 70–99)
Glucose-Capillary: 153 mg/dL — ABNORMAL HIGH (ref 70–99)

## 2014-08-01 LAB — CREATININE, URINE, RANDOM: Creatinine, Urine: 105.5 mg/dL

## 2014-08-01 LAB — TECHNOLOGIST SMEAR REVIEW

## 2014-08-01 LAB — SODIUM, URINE, RANDOM: Sodium, Ur: 35 mEq/L

## 2014-08-01 LAB — CLOSTRIDIUM DIFFICILE BY PCR: CDIFFPCR: NEGATIVE

## 2014-08-01 MED ORDER — NEPRO/CARBSTEADY PO LIQD
237.0000 mL | Freq: Three times a day (TID) | ORAL | Status: DC
Start: 2014-08-01 — End: 2014-08-05
  Administered 2014-08-01 – 2014-08-05 (×9): 237 mL via ORAL
  Filled 2014-08-01 (×15): qty 237

## 2014-08-01 NOTE — Consult Note (Signed)
[facility] CONSULT NOTE  Patient Care Team: [name], MD as PCP - General (Family Medicine)  CHIEF COMPLAINTS/PURPOSE OF CONSULTATION:  Thrombocytopenia  HISTORY OF PRESENTING ILLNESS:  Luke Johnson 80 y.o. male is admitted to the hospital with acute on chronic renal failure. He had developed acute change in mental status was used in the hospital. He developed acute thrombocytopenia and decrease his platelet count from 146 down to 64. He had evidence suggestive of rhabdomyolysis. He is currently getting IV fluids for hydration and his renal function appears to be improving. His creatinine was 3.94 were consulted to assess the cause of thrombocytopenia and to help with determining the best management for this thrombocytopenia issue. Patient was able to answer some questions. He was unable to open his eyes. He denied any pain or discomfort. He denied any bleeding symptoms. He was also diagnosed with urinary tract infection with sepsis. Urine cultures revealed Proteus species. Is currently on ceftriaxone IV. Coagulation tests revealed elevated d-dimer. PT and PTT was slightly prolonged with an INR of 1.49 and a fibrinogen of 644.  MEDICAL HISTORY:  Past Medical History  Diagnosis Date  . 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 Chronic kidney disease, stage III (moderate)   . Legal blindness, as defined in [place]   . Carcinoma in situ of prostate   . Anemia, unspecified   . Type II or unspecified type diabetes mellitus with renal manifestations, not stated as uncontrolled   . Other and unspecified hyperlipidemia   . Edema   . Obesity, unspecified   . Diabetes mellitus   . Dyslipidemia     SURGICAL HISTORY: Past Surgical History  Procedure Laterality Date  . No past surgeries      SOCIAL HISTORY: History   Social History  . Marital Status: Single    Spouse Name:  N/A    Number of Children: N/A  . Years of Education: N/A   Occupational History  . Not on file.   Social History Main Topics  . Smoking status: Former Research scientist (life sciences)  . Smokeless tobacco: Never Used  . Alcohol Use: No  . Drug Use: No  . Sexual Activity: No   Other Topics Concern  . Not on file   Social History Narrative  . No narrative on file    FAMILY HISTORY: History reviewed. No pertinent family history.  ALLERGIES:  has No Known Allergies.  MEDICATIONS:  Current Facility-Administered Medications  Medication Dose Route Frequency Provider Last Rate Last Dose  . acetaminophen (TYLENOL) tablet 650 mg  650 mg Oral Q6H PRN Phillips Climes, MD       Or  . acetaminophen (TYLENOL) suppository 650 mg  650 mg Rectal Q6H PRN Dawood Elgergawy, MD      . albuterol (PROVENTIL) (2.5 MG/3ML) 0.083% nebulizer solution 2.5 mg  2.5 mg Nebulization Q2H PRN Phillips Climes, MD      . antiseptic oral rinse (BIOTENE) solution 15 mL  15 mL Mouth Rinse BID Phillips Climes, MD   15 mL at 08/01/14 1024  . bicalutamide (CASODEX) tablet 50 mg  50 mg Oral Daily Phillips Climes, MD   50 mg at 08/01/14 1016  . bisacodyl (DULCOLAX) EC tablet 5 mg  5 mg Oral Daily PRN Phillips Climes, MD      . cefTRIAXone (ROCEPHIN) 1 g in dextrose 5 % 50 mL IVPB  1 g Intravenous Q24H Mickel Baas  Drue Novel, RPH   1 g at 07/31/14 1357  . cyanocobalamin tablet 500 mcg  500 mcg Oral Daily Phillips Climes, MD   500 mcg at 08/01/14 1016  . feeding supplement (NEPRO CARB STEADY) liquid 237 mL  237 mL Oral TID WC Phillips Climes, MD   237 mL at 08/01/14 0837  . ferrous fumarate (HEMOCYTE - 106 mg FE) tablet 106 mg of iron  106 mg of iron Oral QPC breakfast Phillips Climes, MD   106 mg of iron at 08/01/14 0838  . insulin aspart (novoLOG) injection 0-9 Units  0-9 Units Subcutaneous TID WC Phillips Climes, MD   2 Units at 07/31/14 1250  . ondansetron (ZOFRAN) tablet 4 mg  4 mg Oral Q6H PRN Phillips Climes, MD       Or  . ondansetron  (ZOFRAN) injection 4 mg  4 mg Intravenous Q6H PRN Dawood Elgergawy, MD      . polyethylene glycol (MIRALAX / GLYCOLAX) packet 17 g  17 g Oral Daily PRN Phillips Climes, MD      . senna (SENOKOT) tablet 8.6 mg  1 tablet Oral BID Phillips Climes, MD   8.6 mg at 07/31/14 2200  . sodium bicarbonate 150 mEq in sterile water 1,000 mL infusion   Intravenous Continuous Donato Heinz, MD 100 mL/hr at 07/31/14 1603      REVIEW OF SYSTEMS:   Constitutional: Patient appears to be accounted sitting up in a chair but answers questions Eyes: Unable to open. there appeared to be shut closed Ears, nose, mouth, throat, and face: Dryness in the mouth Respiratory: Denies cough, dyspnea or wheezes Cardiovascular: Denies palpitation, chest discomfort or lower extremity swelling Gastrointestinal:  Diarrhea Neurological: Profoundly weak All other systems were reviewed with the patient and are negative.  PHYSICAL EXAMINATION: ECOG PERFORMANCE STATUS: 3 - Symptomatic, >50% confined to bed  Filed Vitals:   08/01/14 0423  BP: 108/50  Pulse: 108  Temp: 99.2 F (37.3 C)  Resp: 40   Filed Weights   08/01/14 0423  Weight: 166 lb 7.2 oz (75.5 kg)    GENERAL: Somnolent but answers questions  SKIN: Dryness of skin EYES: Could not open because of thick secretions but upon opening pupils are reactive to light LUNGS: clear to auscultation  HEART: regular rate & rhythm and no murmurs and bilateral SCDs ABDOMEN:abdomen soft, non-tender and normal bowel sounds NEURO: Not fully examined   LABORATORY DATA:  I have reviewed the data as listed Lab Results  Component Value Date   WBC 16.9* 08/01/2014   HGB 8.8* 08/01/2014   HCT 26.2* 08/01/2014   MCV 78.4 08/01/2014   PLT 64* 08/01/2014   Lab Results  Component Value Date   NA 146 08/01/2014   K 3.9 08/01/2014   CL 108 08/01/2014   CO2 22 08/01/2014    ASSESSMENT AND PLAN:  1. thrombocytopenia: Patient's platelets have declined from 146 on  admission down to 64. Differential diagnosis is between infection versus meds versus autoimmune. This is not heparin-induced thrombocytopenia because the timeline is too soon for HIT. This is not TTP or HUS because there are no schistocytes. Even though PT and PTT is slightly prolonged, fibrinogen is still normal. This does not rule out DIC entirely but it is low on the differential. Ceftriaxone can cause thrombocytopenia.   Once we know the antibiotic sensitivity of Proteus, please consider antibiotic treatment change to quinolones if it is sensitive.  2. At this point there is no indication for platelet transfusion of  additional investigations including bone marrow biopsy.  3. anemia: Previous labs were reviewed including iron studies F63 folic acid which were all normal. I suspect this is anemia of chronic kidney disease.  4. acute and chronic renal failure with evidence of rhabdomyolysis an elderly male: It does indicate poor prognosis.  Will follow. Thank you very much for the consultation     Rulon Eisenmenger, MD _0 @ 11:52 AM

## 2014-08-01 NOTE — Consult Note (Signed)
Urology Consult  Referring physician:   Dr. Phillips Climes Reason for referral:   Prostate cancer  Chief Complaint:  Pt unable to communicate  History of Present Illness:Luke Johnson is a 80 y.o. Male, nursing home resident,  with known past medical history of prostate cancer,  hypertension, blindness, dyslipidemia, edema, anemia, was found on the floor of the  skilled nursing facility, poorly responsive, and transported to the Hospital.  The  patient is a poor historian, and can't provide any reliable history, although, by hx he is usually oriented, and active.  CT head,/CT cervical spine, did not show any acute findings.  Patient workup was significant for UTI, will leukocytosis of 17,000 ,started on Rocephin in ED, as well patient was found to be in acute on chronic renal failure last known creatinine 1.9, creatinine was 4.4 on admission, no urinary retention 150 cc on bladder scan.  Patient had elevated CK, secondary to Rhabdo, continues to have worsening a creatinine, despite aggressive hydration, renal were consulted.   He was recently seen by Dr. Alinda Money ( [facility]), 07/21/14, with biochemically reoccurring,  but slow doubling time prostate cancer , post brachytherapy in mid-1990's. Most recent psa was 6.29, up from 5.14 in 2014. Metastatic survey in 2012 was negative. He was placed on Casodex and followed by his primary care MD ( Latrice Polite, [facility], Dr. [name]).    Past Medical History  Diagnosis Date  . 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 Chronic kidney disease, stage III (moderate)   . Legal blindness, as defined in [place]   . Carcinoma in situ of prostate   . Anemia, unspecified   . Type II or unspecified type diabetes mellitus with renal manifestations, not stated as uncontrolled   . Other and unspecified hyperlipidemia   . Edema   . Obesity, unspecified   .  Diabetes mellitus   . Dyslipidemia    Past Surgical History  Procedure Laterality Date  . No past surgeries      Medications: I have reviewed the patient's current medications. Allergies: No Known Allergies  History reviewed. No pertinent family history. Social History:  reports that he has quit smoking. He has never used smokeless tobacco. He reports that he does not drink alcohol or use illicit drugs.  ROS: All systems are reviewed and negative except as noted.   Physical Exam:  Vital signs in last 24 hours: Temp:  [99.1 F (37.3 C)-99.2 F (37.3 C)] 99.2 F (37.3 C) (10/18 0423) Pulse Rate:  [84-108] 108 (10/18 0423) Resp:  [20-40] 40 (10/18 0423) BP: (108-109)/(50-61) 108/50 mmHg (10/18 0423) SpO2:  [97 %-98 %] 97 % (10/18 0423) Weight:  [75.5 kg (166 lb 7.2 oz)] 75.5 kg (166 lb 7.2 oz) (10/18 0423)  Cardiovascular: Skin warm; not flushed Respiratory: Breaths quiet; no shortness of breath Abdomen: No masses Neurological: Normal sensation to touch Musculoskeletal: Normal motor function arms and legs Lymphatics: No inguinal adenopathy Skin: No rashes Genitourinary:  Laboratory Data:  Results for orders placed during the hospital encounter of 07/30/14 (from the past 72 hour(s))  CBG MONITORING, ED     Status: Abnormal   Collection Time    07/30/14 12:59 PM      Result Value Ref Range   Glucose-Capillary 238 (*) 70 - 99 mg/dL   Comment 1 Notify RN    URINALYSIS, ROUTINE W REFLEX MICROSCOPIC     Status:  Abnormal   Collection Time    07/30/14  1:13 PM      Result Value Ref Range   Color, Urine AMBER (*) YELLOW   Comment: BIOCHEMICALS MAY BE AFFECTED BY COLOR   APPearance CLOUDY (*) CLEAR   Specific Gravity, Urine 1.017  1.005 - 1.030   pH 5.5  5.0 - 8.0   Glucose, UA NEGATIVE  NEGATIVE mg/dL   Hgb urine dipstick NEGATIVE  NEGATIVE   Bilirubin Urine NEGATIVE  NEGATIVE   Ketones, ur NEGATIVE  NEGATIVE mg/dL   Protein, ur 30 (*) NEGATIVE mg/dL   Urobilinogen, UA  1.0  0.0 - 1.0 mg/dL   Nitrite NEGATIVE  NEGATIVE   Leukocytes, UA MODERATE (*) NEGATIVE  URINE MICROSCOPIC-ADD ON     Status: Abnormal   Collection Time    07/30/14  1:13 PM      Result Value Ref Range   Squamous Epithelial / LPF FEW (*) RARE   WBC, UA 7-10  <3 WBC/hpf   RBC / HPF 0-2  <3 RBC/hpf   Bacteria, UA MANY (*) RARE  CBC     Status: Abnormal   Collection Time    07/30/14  1:37 PM      Result Value Ref Range   WBC 17.7 (*) 4.0 - 10.5 K/uL   RBC 3.60 (*) 4.22 - 5.81 MIL/uL   Hemoglobin 9.4 (*) 13.0 - 17.0 g/dL   HCT 28.9 (*) 39.0 - 52.0 %   MCV 80.3  78.0 - 100.0 fL   MCH 26.1  26.0 - 34.0 pg   MCHC 32.5  30.0 - 36.0 g/dL   RDW 14.0  11.5 - 15.5 %   Platelets 146 (*) 150 - 400 K/uL  COMPREHENSIVE METABOLIC PANEL     Status: Abnormal   Collection Time    07/30/14  1:37 PM      Result Value Ref Range   Sodium 136 (*) 137 - 147 mEq/L   Potassium 4.8  3.7 - 5.3 mEq/L   Chloride 99  96 - 112 mEq/L   CO2 21  19 - 32 mEq/L   Glucose, Bld 231 (*) 70 - 99 mg/dL   BUN 60 (*) 6 - 23 mg/dL   Creatinine, Ser 4.41 (*) 0.50 - 1.35 mg/dL   Calcium 8.4  8.4 - 10.5 mg/dL   Total Protein 6.9  6.0 - 8.3 g/dL   Albumin 2.9 (*) 3.5 - 5.2 g/dL   AST 35  0 - 37 U/L   ALT 20  0 - 53 U/L   Alkaline Phosphatase 81  39 - 117 U/L   Total Bilirubin 0.4  0.3 - 1.2 mg/dL   GFR calc non Af Amer 11 (*) >90 mL/min   GFR calc Af Amer 13 (*) >90 mL/min   Comment: (NOTE)     The eGFR has been calculated using the CKD EPI equation.     This calculation has not been validated in all clinical situations.     eGFR's persistently <90 mL/min signify possible Chronic Kidney     Disease.   Anion gap 16 (*) 5 - 15  URINE CULTURE     Status: None   Collection Time    07/30/14  1:52 PM      Result Value Ref Range   Specimen Description URINE, CLEAN CATCH     Special Requests NONE     Culture  Setup Time       Value: 07/30/2014 21:45  Performed at [facility]       Value:  >=100,000 COLONIES/ML     Performed at [facility]   Culture       Value: PROTEUS MIRABILIS     Performed at [facility]   Report Status PENDING    TROPONIN I     Status: None   Collection Time    07/30/14  1:53 PM      Result Value Ref Range   Troponin I <0.30  <0.30 ng/mL   Comment:            Due to the release kinetics of cTnI,     a negative result within the first hours     of the onset of symptoms does not rule out     myocardial infarction with certainty.     If myocardial infarction is still suspected,     repeat the test at appropriate intervals.  CK TOTAL AND CKMB     Status: Abnormal   Collection Time    07/30/14  3:22 PM      Result Value Ref Range   Total CK 754 (*) 7 - 232 U/L   CK, MB 4.7 (*) 0.3 - 4.0 ng/mL   Relative Index 0.6  0.0 - 2.5   Comment: Performed at [facility]  CK     Status: Abnormal   Collection Time    07/30/14  8:37 PM      Result Value Ref Range   Total CK 1152 (*) 7 - 232 U/L  HEMOGLOBIN A1C     Status: None   Collection Time    07/30/14  8:37 PM      Result Value Ref Range   Hemoglobin A1C 5.6  <5.7 %   Comment: (NOTE)                                                                               According to the ADA Clinical Practice Recommendations for 2011, when     HbA1c is used as a screening test:      >=6.5%   Diagnostic of Diabetes Mellitus               (if abnormal result is confirmed)     5.7-6.4%   Increased risk of developing Diabetes Mellitus     References:Diagnosis and Classification of Diabetes Mellitus,Diabetes     XVQM,0867,61(PJKDT 1):S62-S69 and Standards of Medical Care in             Diabetes - 2011,Diabetes Care,2011,34 (Suppl 1):S11-S61.   Mean Plasma Glucose 114  <117 mg/dL   Comment: Performed at [facility]     Status: None   Collection Time    07/30/14  8:37 PM      Result Value Ref Range   Vitamin B-12 484  211 - 911 pg/mL   Comment: Performed at  [facility]     Status: None   Collection Time    07/30/14  8:37 PM      Result Value Ref Range   Folate 8.8     Comment: (NOTE)  Reference Ranges            Deficient:       0.4 - 3.3 ng/mL            Indeterminate:   3.4 - 5.4 ng/mL            Normal:              > 5.4 ng/mL     Performed at [facility]     Status: Abnormal   Collection Time    07/30/14  8:37 PM      Result Value Ref Range   Iron <10 (*) 42 - 135 ug/dL   TIBC Not calculated due to Iron <10.  215 - 435 ug/dL   Saturation Ratios Not calculated due to Iron <10.  20 - 55 %   UIBC 139  125 - 400 ug/dL   Comment: Performed at [facility]     Status: Abnormal   Collection Time    07/30/14  8:37 PM      Result Value Ref Range   Ferritin 1768 (*) 22 - 322 ng/mL   Comment: (NOTE)     Result confirmed by automatic dilution.     Result repeated and verified.     Performed at [place]     Status: Abnormal   Collection Time    07/30/14  8:37 PM      Result Value Ref Range   Retic Ct Pct 1.0  0.4 - 3.1 %   RBC. 3.57 (*) 4.22 - 5.81 MIL/uL   Retic Count, Manual 35.7  19.0 - 186.0 K/uL  GLUCOSE, CAPILLARY     Status: Abnormal   Collection Time    07/30/14  9:26 PM      Result Value Ref Range   Glucose-Capillary 126 (*) 70 - 99 mg/dL  MRSA PCR SCREENING     Status: None   Collection Time    07/30/14 10:11 PM      Result Value Ref Range   MRSA by PCR NEGATIVE  NEGATIVE   Comment:            The GeneXpert MRSA Assay (FDA     approved for NASAL specimens     only), is one component of a     comprehensive MRSA colonization     surveillance program. It is not     intended to diagnose MRSA     infection nor to guide or     monitor treatment for     MRSA infections.  BASIC METABOLIC PANEL     Status: Abnormal   Collection Time    07/30/14 10:50 PM      Result Value Ref Range   Sodium 139  137 - 147 mEq/L   Potassium 3.7   3.7 - 5.3 mEq/L   Comment: DELTA CHECK NOTED   Chloride 103  96 - 112 mEq/L   CO2 18 (*) 19 - 32 mEq/L   Glucose, Bld 122 (*) 70 - 99 mg/dL   BUN 66 (*) 6 - 23 mg/dL   Creatinine, Ser 4.69 (*) 0.50 - 1.35 mg/dL   Calcium 8.1 (*) 8.4 - 10.5 mg/dL   GFR calc non Af Amer 10 (*) >90 mL/min   GFR calc Af Amer 12 (*) >90 mL/min   Comment: (NOTE)     The eGFR has been calculated using the CKD EPI equation.  This calculation has not been validated in all clinical situations.     eGFR's persistently <90 mL/min signify possible Chronic Kidney     Disease.   Anion gap 18 (*) 5 - 15  CK     Status: Abnormal   Collection Time    07/30/14 10:50 PM      Result Value Ref Range   Total CK 1215 (*) 7 - 232 U/L  CBC     Status: Abnormal   Collection Time    07/31/14  2:50 AM      Result Value Ref Range   WBC 17.8 (*) 4.0 - 10.5 K/uL   RBC 3.36 (*) 4.22 - 5.81 MIL/uL   Hemoglobin 8.9 (*) 13.0 - 17.0 g/dL   HCT 26.8 (*) 39.0 - 52.0 %   MCV 79.8  78.0 - 100.0 fL   MCH 26.5  26.0 - 34.0 pg   MCHC 33.2  30.0 - 36.0 g/dL   RDW 14.0  11.5 - 15.5 %   Platelets 104 (*) 150 - 400 K/uL   Comment: DELTA CHECK NOTED     REPEATED TO VERIFY     SPECIMEN CHECKED FOR CLOTS     PLATELET COUNT CONFIRMED BY SMEAR  BASIC METABOLIC PANEL     Status: Abnormal   Collection Time    07/31/14  2:50 AM      Result Value Ref Range   Sodium 139  137 - 147 mEq/L   Potassium 3.9  3.7 - 5.3 mEq/L   Chloride 104  96 - 112 mEq/L   CO2 17 (*) 19 - 32 mEq/L   Glucose, Bld 102 (*) 70 - 99 mg/dL   BUN 65 (*) 6 - 23 mg/dL   Creatinine, Ser 4.65 (*) 0.50 - 1.35 mg/dL   Calcium 7.9 (*) 8.4 - 10.5 mg/dL   GFR calc non Af Amer 10 (*) >90 mL/min   GFR calc Af Amer 12 (*) >90 mL/min   Comment: (NOTE)     The eGFR has been calculated using the CKD EPI equation.     This calculation has not been validated in all clinical situations.     eGFR's persistently <90 mL/min signify possible Chronic Kidney     Disease.   Anion gap  18 (*) 5 - 15  CK     Status: Abnormal   Collection Time    07/31/14  2:50 AM      Result Value Ref Range   Total CK 1102 (*) 7 - 232 U/L  BASIC METABOLIC PANEL     Status: Abnormal   Collection Time    07/31/14  6:49 AM      Result Value Ref Range   Sodium 141  137 - 147 mEq/L   Potassium 4.3  3.7 - 5.3 mEq/L   Chloride 107  96 - 112 mEq/L   CO2 17 (*) 19 - 32 mEq/L   Glucose, Bld 118 (*) 70 - 99 mg/dL   BUN 68 (*) 6 - 23 mg/dL   Creatinine, Ser 4.83 (*) 0.50 - 1.35 mg/dL   Calcium 7.8 (*) 8.4 - 10.5 mg/dL   GFR calc non Af Amer 10 (*) >90 mL/min   GFR calc Af Amer 11 (*) >90 mL/min   Comment: (NOTE)     The eGFR has been calculated using the CKD EPI equation.     This calculation has not been validated in all clinical situations.     eGFR's persistently <90 mL/min signify  possible Chronic Kidney     Disease.   Anion gap 17 (*) 5 - 15  CK     Status: Abnormal   Collection Time    07/31/14  6:49 AM      Result Value Ref Range   Total CK 990 (*) 7 - 232 U/L  GLUCOSE, CAPILLARY     Status: Abnormal   Collection Time    07/31/14  7:56 AM      Result Value Ref Range   Glucose-Capillary 118 (*) 70 - 99 mg/dL   Comment 1 Documented in Chart     Comment 2 Notify RN    BASIC METABOLIC PANEL     Status: Abnormal   Collection Time    07/31/14 10:44 AM      Result Value Ref Range   Sodium 136 (*) 137 - 147 mEq/L   Potassium 4.7  3.7 - 5.3 mEq/L   Chloride 105  96 - 112 mEq/L   CO2 12 (*) 19 - 32 mEq/L   Glucose, Bld 115 (*) 70 - 99 mg/dL   BUN 67 (*) 6 - 23 mg/dL   Creatinine, Ser 4.73 (*) 0.50 - 1.35 mg/dL   Calcium 8.2 (*) 8.4 - 10.5 mg/dL   GFR calc non Af Amer 10 (*) >90 mL/min   GFR calc Af Amer 12 (*) >90 mL/min   Comment: (NOTE)     The eGFR has been calculated using the CKD EPI equation.     This calculation has not been validated in all clinical situations.     eGFR's persistently <90 mL/min signify possible Chronic Kidney     Disease.   Anion gap 19 (*) 5 - 15   CK     Status: Abnormal   Collection Time    07/31/14 10:44 AM      Result Value Ref Range   Total CK 1160 (*) 7 - 232 U/L  GLUCOSE, CAPILLARY     Status: Abnormal   Collection Time    07/31/14 12:13 PM      Result Value Ref Range   Glucose-Capillary 166 (*) 70 - 99 mg/dL   Comment 1 Documented in Chart     Comment 2 Notify RN    CK     Status: Abnormal   Collection Time    07/31/14  1:33 PM      Result Value Ref Range   Total CK 1102 (*) 7 - 232 U/L  DIC (DISSEMINATED INTRAVASCULAR COAGULATION) PANEL     Status: Abnormal   Collection Time    07/31/14  3:12 PM      Result Value Ref Range   Prothrombin Time 18.2 (*) 11.6 - 15.2 seconds   INR 1.49  0.00 - 1.49   aPTT 45 (*) 24 - 37 seconds   Comment:            IF BASELINE aPTT IS ELEVATED,     SUGGEST PATIENT RISK ASSESSMENT     BE USED TO DETERMINE APPROPRIATE     ANTICOAGULANT THERAPY.   Fibrinogen 644 (*) 204 - 475 mg/dL   D-Dimer, Quant 5.30 (*) 0.00 - 0.48 ug/mL-FEU   Comment:            AT THE INHOUSE ESTABLISHED CUTOFF     VALUE OF 0.48 ug/mL FEU,     THIS ASSAY HAS BEEN DOCUMENTED     IN THE LITERATURE TO HAVE     A SENSITIVITY AND NEGATIVE  PREDICTIVE VALUE OF AT LEAST     98 TO 99%.  THE TEST RESULT     SHOULD BE CORRELATED WITH     AN ASSESSMENT OF THE CLINICAL     PROBABILITY OF DVT / VTE.   Platelets 84 (*) 150 - 400 K/uL   Comment: RESULT REPEATED AND VERIFIED     SPECIMEN CHECKED FOR CLOTS     PLATELET COUNT CONFIRMED BY SMEAR   Smear Review NO SCHISTOCYTES SEEN    GLUCOSE, CAPILLARY     Status: Abnormal   Collection Time    07/31/14  5:05 PM      Result Value Ref Range   Glucose-Capillary 109 (*) 70 - 99 mg/dL   Comment 1 Documented in Chart     Comment 2 Notify RN    SODIUM, URINE, RANDOM     Status: None   Collection Time    07/31/14  5:59 PM      Result Value Ref Range   Sodium, Ur 35     Comment: Performed at [place], URINE, RANDOM     Status: None    Collection Time    07/31/14  5:59 PM      Result Value Ref Range   Creatinine, Urine 105.5     Comment: No reference range established.     Performed at [place], CAPILLARY     Status: None   Collection Time    07/31/14 10:06 PM      Result Value Ref Range   Glucose-Capillary 92  70 - 99 mg/dL  CBC     Status: Abnormal   Collection Time    08/01/14  5:36 AM      Result Value Ref Range   WBC 16.9 (*) 4.0 - 10.5 K/uL   RBC 3.34 (*) 4.22 - 5.81 MIL/uL   Hemoglobin 8.8 (*) 13.0 - 17.0 g/dL   HCT 26.2 (*) 39.0 - 52.0 %   MCV 78.4  78.0 - 100.0 fL   MCH 26.3  26.0 - 34.0 pg   MCHC 33.6  30.0 - 36.0 g/dL   RDW 14.1  11.5 - 15.5 %   Platelets 64 (*) 150 - 400 K/uL   Comment: SPECIMEN CHECKED FOR CLOTS     REPEATED TO VERIFY     DELTA CHECK NOTED     PLATELET COUNT CONFIRMED BY SMEAR  RENAL FUNCTION PANEL     Status: Abnormal   Collection Time    08/01/14  5:36 AM      Result Value Ref Range   Sodium 146  137 - 147 mEq/L   Comment: DELTA CHECK NOTED     REPEATED TO VERIFY   Potassium 3.9  3.7 - 5.3 mEq/L   Comment: DELTA CHECK NOTED     REPEATED TO VERIFY   Chloride 108  96 - 112 mEq/L   CO2 22  19 - 32 mEq/L   Glucose, Bld 92  70 - 99 mg/dL   BUN 68 (*) 6 - 23 mg/dL   Creatinine, Ser 3.94 (*) 0.50 - 1.35 mg/dL   Calcium 7.8 (*) 8.4 - 10.5 mg/dL   Phosphorus 4.0  2.3 - 4.6 mg/dL   Albumin 2.0 (*) 3.5 - 5.2 g/dL   GFR calc non Af Amer 13 (*) >90 mL/min   GFR calc Af Amer 15 (*) >90 mL/min   Comment: (NOTE)     The eGFR has been calculated using the CKD EPI  equation.     This calculation has not been validated in all clinical situations.     eGFR's persistently <90 mL/min signify possible Chronic Kidney     Disease.   Anion gap 16 (*) 5 - 15  GLUCOSE, CAPILLARY     Status: Abnormal   Collection Time    08/01/14  7:28 AM      Result Value Ref Range   Glucose-Capillary 102 (*) 70 - 99 mg/dL  CLOSTRIDIUM DIFFICILE BY PCR     Status: None   Collection  Time    08/01/14 11:40 AM      Result Value Ref Range   C difficile by pcr NEGATIVE  NEGATIVE   Comment: Performed at [facility]  TECHNOLOGIST SMEAR REVIEW     Status: None   Collection Time    08/01/14 11:48 AM      Result Value Ref Range   Tech Review ELLIPTOCYTES     Comment: CRENATED RBCs     NO PLATELET CLUMPING NOTED  GLUCOSE, CAPILLARY     Status: Abnormal   Collection Time    08/01/14 11:49 AM      Result Value Ref Range   Glucose-Capillary 121 (*) 70 - 99 mg/dL   Recent Results (from the past 240 hour(s))  URINE CULTURE     Status: None   Collection Time    07/30/14  1:52 PM      Result Value Ref Range Status   Specimen Description URINE, CLEAN CATCH   Final   Special Requests NONE   Final   Culture  Setup Time     Final   Value: 07/30/2014 21:45     Performed at [place]     Final   Value: >=100,000 COLONIES/ML     Performed at [facility]   Culture     Final   Value: PROTEUS MIRABILIS     Performed at [facility]   Report Status PENDING   Incomplete  MRSA PCR SCREENING     Status: None   Collection Time    07/30/14 10:11 PM      Result Value Ref Range Status   MRSA by PCR NEGATIVE  NEGATIVE Final   Comment:            The GeneXpert MRSA Assay (FDA     approved for NASAL specimens     only), is one component of a     comprehensive MRSA colonization     surveillance program. It is not     intended to diagnose MRSA     infection nor to guide or     monitor treatment for     MRSA infections.  CLOSTRIDIUM DIFFICILE BY PCR     Status: None   Collection Time    08/01/14 11:40 AM      Result Value Ref Range Status   C difficile by pcr NEGATIVE  NEGATIVE Final   Comment: Performed at [facility]   Creatinine:  Recent Labs  07/30/14 1337 07/30/14 2250 07/31/14 0250 07/31/14 0649 07/31/14 1044 08/01/14 0536  CREATININE 4.41* 4.69* 4.65* 4.83* 4.73* 3.94*     Xrays:   Impression/Assessment:   known slowly progressive biochemically recurrent CaP, on casodex 79m/day. Would recommend continuing Casocex, 562mday.   Plan:  Follow-up, Dr. BoAlinda Moneyer office appointment in 6 months.  Oliviana Mcgahee I 08/01/2014, 4:02 PM

## 2014-08-01 NOTE — Progress Notes (Signed)
Patient Demographics  Luke Johnson, is a 80 y.o. male, DOB - 12/08/1925, LAG:[id number]  Admit date - 07/30/2014   Admitting Physician Phillips Climes, MD  Outpatient Primary MD for the patient is [name], MD  LOS - 2   Chief Complaint  Patient presents with  . Hyperglycemia  . Fall    brief narrative:  Luke Johnson is a 80 y.o. male, with known past medical history of hypertension, blindness, malignant neoplasm of prostate, dyslipidemia, edema, anemia, represents from skilled nursing facility as he was found on the floor, patient is a poor historian, can't provide any reliable history, usually he is oriented active, CT head,/CT cervical spine, did not show any acute finding or any.  Patient workup was significant for UTI, will leukocytosis of 17,000 ,started on Rocephin in ED, as well patient was found to be in acute on chronic renal failure last known creatinine 1.9, creatinine was 4.4 on admission, no urinary retention 150 cc on bladder scan. Patient had elevated CK, secondary to Rhabdo, continues to have worsening a creatinine, renal were consulted, renal ultrasound showing mild left hydronephrosis with nonobstructive renal stones, patient's renal function continues to improve with IV hydration, but platelets continue to drop.  Subjective:   Yvonne Kendall today has, No headache, No chest pain, No abdominal pain - No Nausea, No new weakness tingling or numbness, No Cough - SOB. Appears to be more coherent today. Has diarrhea today,  Assessment & Plan    Active Problems:   Acute on chronic renal failure   Acute encephalopathy   UTI (lower urinary tract infection)   Prostate cancer   HTN (hypertension)   Dyslipidemia   Hyperglycemia   Anemia  acute on chronic renal failure  -Baseline creatinine 1.9, 4.4 on admission 10/16 ,today is 3.9 -Most likely  related to Rhabdo ,volume depletion and dehydration, no evidence of urinary retention at this point. -Unlikely HUS given no schistocytes on smear. -Nephrology input is appreciated.  Acute encephalopathy  -Metabolic encephalopathy secondary to his UTI, worsening renal failure, no acute finding in CT head, no focal neurological deficit , HUS workup is negative. -Continues to improve.  Thrombocytopenia -Patient has no schistocytes, but elevated fibrinogen, d-dimer , so DIC is a possibility, will DC heparin, will check HIT.  UTI  -Continue with Rocephin, urine culture growing Proteus  Hypertension  -Will hold all meds as blood pressure is on the lower side   Hyperglycemia  -Patient was noticed to have hyperglycemia at the skilled nursing facility. -There is no official diagnosis of diabetes mellitus, hemoglobin A1c is 5.6, is on insulin sliding scale, but so far his glucose level are acceptable, so insulin sliding scale was discontinued 10/18.  Hyperlipidemia  -Will hold statin due to Rhabdomyolysis .  Prostate cancer  -Continue with Casodex  Diarrhea -Will check C. difficile   Code Status: Full  Family Communication: Spoke with his cousin 10/16  Disposition Plan: SNF when stable   Procedures  None  Consults   Renal   Medications  Scheduled Meds: . antiseptic oral rinse  15 mL Mouth Rinse BID  . bicalutamide  50 mg Oral Daily  . cefTRIAXone (ROCEPHIN)  IV  1 g Intravenous Q24H  . cyanocobalamin  500 mcg Oral Daily  .  feeding supplement (NEPRO CARB STEADY)  237 mL Oral TID WC  . ferrous fumarate  106 mg of iron Oral QPC breakfast  . insulin aspart  0-9 Units Subcutaneous TID WC  . senna  1 tablet Oral BID   Continuous Infusions: .  sodium bicarbonate 150 mEq in sterile water 1000 mL infusion 100 mL/hr at 07/31/14 1603   PRN Meds:.acetaminophen, acetaminophen, albuterol, bisacodyl, ondansetron (ZOFRAN) IV, ondansetron, polyethylene glycol  DVT Prophylaxis  SCDs    Lab Results  Component Value Date   PLT 64* 08/01/2014    Antibiotics    Anti-infectives   Start     Dose/Rate Route Frequency Ordered Stop   07/31/14 1400  cefTRIAXone (ROCEPHIN) 1 g in dextrose 5 % 50 mL IVPB     1 g 100 mL/hr over 30 Minutes Intravenous Every 24 hours 07/30/14 2138     07/30/14 1415  cefTRIAXone (ROCEPHIN) 1 g in dextrose 5 % 50 mL IVPB     1 g 100 mL/hr over 30 Minutes Intravenous  Once 07/30/14 1403 07/30/14 1505          Objective:   Filed Vitals:   07/31/14 1445 07/31/14 2206 08/01/14 0423 08/01/14 0700  BP: 117/44 109/61 108/50   Pulse: 83 84 108   Temp: 97.5 F (36.4 C) 99.1 F (37.3 C) 99.2 F (37.3 C)   TempSrc: Oral Oral Oral   Resp: 22 20 40   Height:    5\' 6"  (1.676 m)  Weight:   75.5 kg (166 lb 7.2 oz)   SpO2: 96% 98% 97%     Wt Readings from Last 3 Encounters:  08/01/14 75.5 kg (166 lb 7.2 oz)     Intake/Output Summary (Last 24 hours) at 08/01/14 1116 Last data filed at 08/01/14 0919  Gross per 24 hour  Intake 291.67 ml  Output   1100 ml  Net -808.33 ml     Physical Exam  Awake Alert, Oriented X 3, No new F.N deficits, Normal affect, improved mentation today. [place].AT, legally blind, with total opacification of cornea Supple Neck,No JVD, No cervical lymphadenopathy appriciated.  Symmetrical Chest wall movement, Good air movement bilaterally, CTAB RRR,No Gallops,Rubs or new Murmurs, No Parasternal Heave +ve B.Sounds, Abd Soft, No tenderness, No organomegaly appriciated, No rebound - guarding or rigidity. No Cyanosis, Clubbing or edema, No new Rash or bruise     Data Review   Micro Results Recent Results (from the past 240 hour(s))  URINE CULTURE     Status: None   Collection Time    07/30/14  1:52 PM      Result Value Ref Range Status   Specimen Description URINE, CLEAN CATCH   Final   Special Requests NONE   Final   Culture  Setup Time     Final   Value: 07/30/2014 21:45     Performed at [facility]     Final   Value: >=100,000 COLONIES/ML     Performed at [facility]   Culture     Final   Value: PROTEUS MIRABILIS     Performed at [facility]   Report Status PENDING   Incomplete  MRSA PCR SCREENING     Status: None   Collection Time    07/30/14 10:11 PM      Result Value Ref Range Status   MRSA by PCR NEGATIVE  NEGATIVE Final   Comment:  The GeneXpert MRSA Assay (FDA     approved for NASAL specimens     only), is one component of a     comprehensive MRSA colonization     surveillance program. It is not     intended to diagnose MRSA     infection nor to guide or     monitor treatment for     MRSA infections.    Radiology Reports Dg Chest 2 View  07/30/2014   CLINICAL DATA:  Found down on the floor. Hyperglycemia with history of diabetes.  EXAM: CHEST  2 VIEW  COMPARISON:  12/2009  FINDINGS: Cardiac silhouette is upper limits of normal to mildly enlarged in size. Large hiatal hernia is again seen. The lungs are clear. No pleural effusion or pneumothorax is identified. No acute osseous abnormality is seen.  IMPRESSION: No active cardiopulmonary disease.  Hiatal hernia.   Electronically Signed   By: Logan Bores   On: 07/30/2014 14:44   Ct Head Wo Contrast  07/30/2014   CLINICAL DATA:  Initial evaluation for confusion, patient states that he fell but cannot remember within where or hila  EXAM: CT HEAD WITHOUT CONTRAST  CT CERVICAL SPINE WITHOUT CONTRAST  TECHNIQUE: Multidetector CT imaging of the head and cervical spine was performed following the standard protocol without intravenous contrast. Multiplanar CT image reconstructions of the cervical spine were also generated.  COMPARISON:  06/28/2012  FINDINGS: CT HEAD FINDINGS  Moderate to severe atrophy. Mild low attenuation in the deep white matter. No hemorrhage or extra-axial fluid. No evidence of mass or vascular territory infarct. No hydrocephalus. Calvarium is intact.  CT CERVICAL SPINE  FINDINGS  Multifocal intravenous air in the right neck likely due to IV access and injection. No other soft tissue abnormalities.  No fracture identified. No paraspinous hematoma. Normal anterior-posterior alignment. Multilevel degenerative disc disease. Degenerative change also involves posterior elements. Fusion of the posterior elements at C4 and C5 may be congenital.  IMPRESSION: No acute intracranial abnormality. No evidence of acute traumatic injury involving the cervical spine.   Electronically Signed   By: Skipper Cliche M.D.   On: 07/30/2014 14:42   Ct Cervical Spine Wo Contrast  07/30/2014   CLINICAL DATA:  Initial evaluation for confusion, patient states that he fell but cannot remember within where or hila  EXAM: CT HEAD WITHOUT CONTRAST  CT CERVICAL SPINE WITHOUT CONTRAST  TECHNIQUE: Multidetector CT imaging of the head and cervical spine was performed following the standard protocol without intravenous contrast. Multiplanar CT image reconstructions of the cervical spine were also generated.  COMPARISON:  06/28/2012  FINDINGS: CT HEAD FINDINGS  Moderate to severe atrophy. Mild low attenuation in the deep white matter. No hemorrhage or extra-axial fluid. No evidence of mass or vascular territory infarct. No hydrocephalus. Calvarium is intact.  CT CERVICAL SPINE FINDINGS  Multifocal intravenous air in the right neck likely due to IV access and injection. No other soft tissue abnormalities.  No fracture identified. No paraspinous hematoma. Normal anterior-posterior alignment. Multilevel degenerative disc disease. Degenerative change also involves posterior elements. Fusion of the posterior elements at C4 and C5 may be congenital.  IMPRESSION: No acute intracranial abnormality. No evidence of acute traumatic injury involving the cervical spine.   Electronically Signed   By: Skipper Cliche M.D.   On: 07/30/2014 14:42   US Renal  07/31/2014   CLINICAL DATA:  Renal failure.  EXAM: RENAL/URINARY TRACT  ULTRASOUND COMPLETE  COMPARISON:  CT abdomen and pelvis 09/28/2010.  FINDINGS:  Right Kidney:  Length: 8.5 cm. Cortical echogenicity appears increased. No hydronephrosis is identified. 0.8 cm cyst is incidentally noted.  Left Kidney:  Length: 11.6 cm. 1.2 cm echogenic focus with posterior shadowing compatible with a stone is seen the lower pole. Multiple additional calcifications are seen in the left kidney measuring up to 2.0 cm. There is mild to moderate left hydronephrosis is identified.  Bladder:  Decompressed with a Foley catheter in place.  Small bilateral pleural effusions are identified.  IMPRESSION: Mild to moderate left hydronephrosis with multiple nonobstructing stone seen in the left kidney. Cause for hydronephrosis is not identified.  Cortical thinning of the right kidney with increased echogenicity consistent with medical renal disease.  Small bilateral pleural effusions.   Electronically Signed   By: Inge Rise M.D.   On: 07/31/2014 13:02    CBC  Recent Labs Lab 07/30/14 1337 07/31/14 0250 07/31/14 1512 08/01/14 0536  WBC 17.7* 17.8*  --  16.9*  HGB 9.4* 8.9*  --  8.8*  HCT 28.9* 26.8*  --  26.2*  PLT 146* 104* 84* 64*  MCV 80.3 79.8  --  78.4  MCH 26.1 26.5  --  26.3  MCHC 32.5 33.2  --  33.6  RDW 14.0 14.0  --  14.1    Chemistries   Recent Labs Lab 07/30/14 1337 07/30/14 2250 07/31/14 0250 07/31/14 0649 07/31/14 1044 08/01/14 0536  NA 136* 139 139 141 136* 146  K 4.8 3.7 3.9 4.3 4.7 3.9  CL 99 103 104 107 105 108  CO2 21 18* 17* 17* 12* 22  GLUCOSE 231* 122* 102* 118* 115* 92  BUN 60* 66* 65* 68* 67* 68*  CREATININE 4.41* 4.69* 4.65* 4.83* 4.73* 3.94*  CALCIUM 8.4 8.1* 7.9* 7.8* 8.2* 7.8*  AST 35  --   --   --   --   --   ALT 20  --   --   --   --   --   ALKPHOS 81  --   --   --   --   --   BILITOT 0.4  --   --   --   --   --     ------------------------------------------------------------------------------------------------------------------ estimated creatinine clearance is 12.4 ml/min (by C-G formula based on Cr of 3.94). ------------------------------------------------------------------------------------------------------------------  Recent Labs  07/30/14 2037  HGBA1C 5.6   ------------------------------------------------------------------------------------------------------------------ No results found for this basename: CHOL, HDL, LDLCALC, TRIG, CHOLHDL, LDLDIRECT,  in the last 72 hours ------------------------------------------------------------------------------------------------------------------ No results found for this basename: TSH, T4TOTAL, FREET3, T3FREE, THYROIDAB,  in the last 72 hours ------------------------------------------------------------------------------------------------------------------  Recent Labs  07/30/14 2037  VITAMINB12 484  FOLATE 8.8  FERRITIN 1768*  TIBC Not calculated due to Iron <10.  IRON <10*  RETICCTPCT 1.0    Coagulation profile  Recent Labs Lab 07/31/14 1512  INR 1.49     Recent Labs  07/31/14 1512  DDIMER 5.30*    Cardiac Enzymes  Recent Labs Lab 07/30/14 1353 07/30/14 1522  CKMB  --  4.7*  TROPONINI <0.30  --    ------------------------------------------------------------------------------------------------------------------ No components found with this basename: POCBNP,      Time Spent in minutes   25 minutes   Neoma Uhrich M.D on 08/01/2014 at 11:16 AM  Between 7am to 7pm - Pager - [phone]  After 7pm go to [email or web address] - password [email or web address]  And look for the night coverage person covering for me after hours  [facility] Office  [phone]   **Disclaimer: This note  may have been dictated with voice recognition software. Similar sounding words can inadvertently be transcribed and this note may  contain transcription errors which may not have been corrected upon publication of note.**

## 2014-08-01 NOTE — Progress Notes (Signed)
Patients having brief episodes of 3rd degree AV block, with cardiology on call, and given it's asymptomatic, brief episodes, they think its more likely related to sepsis, if it becomes symptomatic or longer , then they will officially consult and see the patient as per Dr Kennith Center.

## 2014-08-01 NOTE — Progress Notes (Signed)
Subjective:  Sitting up at side of bed, no complaints- UOP good and creatinine down Objective Vital signs in last 24 hours: Filed Vitals:   07/31/14 1445 07/31/14 2206 08/01/14 0423 08/01/14 0700  BP: 117/44 109/61 108/50   Pulse: 83 84 108   Temp: 97.5 F (36.4 C) 99.1 F (37.3 C) 99.2 F (37.3 C)   TempSrc: Oral Oral Oral   Resp: 22 20 40   Height:    _0  (1.676 m)  Weight:   75.5 kg (166 lb 7.2 oz)   SpO2: 96% 98% 97%    Weight change:   Intake/Output Summary (Last 24 hours) at 08/01/14 1141 Last data filed at 08/01/14 0919  Gross per 24 hour  Intake 291.67 ml  Output   1100 ml  Net -808.33 ml      Assessment/Plan: 80 year old BM with multiple medical issues including CKD now presenting with A on CRF in the setting of UTI-possible urinary obstruction- mild rhabdo    1. AKI/CKD vs. Progressive CKD- pt has not had labs for review in 2 years and now with mild rhabdo and obstruction from nephrolithiasis.    Cont with IVF's of isotonic bicarb in setting of met acidosis and mild rhabdo Need to determine if he has received any abx recently or NSAIDs/cox-II I's or if there is a more recent Scr available for review Consult urology due to left hydro and stones- still pending No indication for dialysis and pt would be a poor candidate for longterm HD.  Thankfully UOP is good and creatinine is down 2. rhabdo- will change to isotonic bicarb IVF's and follow, stop statin 3. Metabolic acidosis- secondary to #1- much better on IV bicarb-will continue 4. Obstructive uropathy- likely due to nephrolithiasis but also had some urinary retention (200cc's in bladder when foley placed earlier today) Consult urology   5. Anemia- normocytic.  ?due to [facility], iron deficiency, or malignancy.  Will check SPEP/UPEP, iron stores, and may require ESA. 6. Thrombocytopenia- in light of possible UTI, ARF, and AMS need to r/o schistocytes. Likely related to early sepsis but will order DIC panel as well.  Hematology consulted 7. Pyuria- await cx and sensitivity.  On rocephin 8. SIRS- hypotensive on arrival with soft BP's since. Cont with IVF's and abx 9. DM- new diagnosis, management per primary svc 10. HTN- hold meds 11. Severe protein and caloric malnutrition- per primary svc      Ranata Laughery A    Labs: Basic Metabolic Panel:  Recent Labs Lab 07/31/14 0649 07/31/14 1044 08/01/14 0536  NA 141 136* 146  K 4.3 4.7 3.9  CL 107 105 108  CO2 17* 12* 22  GLUCOSE 118* 115* 92  BUN 68* 67* 68*  CREATININE 4.83* 4.73* 3.94*  CALCIUM 7.8* 8.2* 7.8*  PHOS  --   --  4.0   Liver Function Tests:  Recent Labs Lab 07/30/14 1337 08/01/14 0536  AST 35  --   ALT 20  --   ALKPHOS 81  --   BILITOT 0.4  --   PROT 6.9  --   ALBUMIN 2.9* 2.0*   No results found for this basename: LIPASE, AMYLASE,  in the last 168 hours No results found for this basename: AMMONIA,  in the last 168 hours CBC:  Recent Labs Lab 07/30/14 1337 07/31/14 0250 07/31/14 1512 08/01/14 0536  WBC 17.7* 17.8*  --  16.9*  HGB 9.4* 8.9*  --  8.8*  HCT 28.9* 26.8*  --  26.2*  MCV 80.3 79.8  --  78.4  PLT 146* 104* 84* 64*   Cardiac Enzymes:  Recent Labs Lab 07/30/14 1353 07/30/14 1522  07/30/14 2250 07/31/14 0250 07/31/14 0649 07/31/14 1044 07/31/14 1333  CKTOTAL  --  754*  < > 1215* 1102* 990* 1160* 1102*  CKMB  --  4.7*  --   --   --   --   --   --   TROPONINI <0.30  --   --   --   --   --   --   --   < > = values in this interval not displayed. CBG:  Recent Labs Lab 07/31/14 0756 07/31/14 1213 07/31/14 1705 07/31/14 2206 08/01/14 0728  GLUCAP 118* 166* 109* 92 102*    Iron Studies:  Recent Labs  07/30/14 2037  IRON <10*  TIBC Not calculated due to Iron <10.  FERRITIN 1768*   Studies/Results: Dg Chest 2 View  07/30/2014   CLINICAL DATA:  Found down on the floor. Hyperglycemia with history of diabetes.  EXAM: CHEST  2 VIEW  COMPARISON:  12/2009  FINDINGS: Cardiac  silhouette is upper limits of normal to mildly enlarged in size. Large hiatal hernia is again seen. The lungs are clear. No pleural effusion or pneumothorax is identified. No acute osseous abnormality is seen.  IMPRESSION: No active cardiopulmonary disease.  Hiatal hernia.   Electronically Signed   By: Logan Bores   On: 07/30/2014 14:44   Ct Head Wo Contrast  07/30/2014   CLINICAL DATA:  Initial evaluation for confusion, patient states that he fell but cannot remember within where or hila  EXAM: CT HEAD WITHOUT CONTRAST  CT CERVICAL SPINE WITHOUT CONTRAST  TECHNIQUE: Multidetector CT imaging of the head and cervical spine was performed following the standard protocol without intravenous contrast. Multiplanar CT image reconstructions of the cervical spine were also generated.  COMPARISON:  06/28/2012  FINDINGS: CT HEAD FINDINGS  Moderate to severe atrophy. Mild low attenuation in the deep white matter. No hemorrhage or extra-axial fluid. No evidence of mass or vascular territory infarct. No hydrocephalus. Calvarium is intact.  CT CERVICAL SPINE FINDINGS  Multifocal intravenous air in the right neck likely due to IV access and injection. No other soft tissue abnormalities.  No fracture identified. No paraspinous hematoma. Normal anterior-posterior alignment. Multilevel degenerative disc disease. Degenerative change also involves posterior elements. Fusion of the posterior elements at C4 and C5 may be congenital.  IMPRESSION: No acute intracranial abnormality. No evidence of acute traumatic injury involving the cervical spine.   Electronically Signed   By: Skipper Cliche M.D.   On: 07/30/2014 14:42   Ct Cervical Spine Wo Contrast  07/30/2014   CLINICAL DATA:  Initial evaluation for confusion, patient states that he fell but cannot remember within where or hila  EXAM: CT HEAD WITHOUT CONTRAST  CT CERVICAL SPINE WITHOUT CONTRAST  TECHNIQUE: Multidetector CT imaging of the head and cervical spine was performed  following the standard protocol without intravenous contrast. Multiplanar CT image reconstructions of the cervical spine were also generated.  COMPARISON:  06/28/2012  FINDINGS: CT HEAD FINDINGS  Moderate to severe atrophy. Mild low attenuation in the deep white matter. No hemorrhage or extra-axial fluid. No evidence of mass or vascular territory infarct. No hydrocephalus. Calvarium is intact.  CT CERVICAL SPINE FINDINGS  Multifocal intravenous air in the right neck likely due to IV access and injection. No other soft tissue abnormalities.  No fracture identified. No paraspinous hematoma. Normal anterior-posterior  alignment. Multilevel degenerative disc disease. Degenerative change also involves posterior elements. Fusion of the posterior elements at C4 and C5 may be congenital.  IMPRESSION: No acute intracranial abnormality. No evidence of acute traumatic injury involving the cervical spine.   Electronically Signed   By: Skipper Cliche M.D.   On: 07/30/2014 14:42   US Renal  07/31/2014   CLINICAL DATA:  Renal failure.  EXAM: RENAL/URINARY TRACT ULTRASOUND COMPLETE  COMPARISON:  CT abdomen and pelvis 09/28/2010.  FINDINGS: Right Kidney:  Length: 8.5 cm. Cortical echogenicity appears increased. No hydronephrosis is identified. 0.8 cm cyst is incidentally noted.  Left Kidney:  Length: 11.6 cm. 1.2 cm echogenic focus with posterior shadowing compatible with a stone is seen the lower pole. Multiple additional calcifications are seen in the left kidney measuring up to 2.0 cm. There is mild to moderate left hydronephrosis is identified.  Bladder:  Decompressed with a Foley catheter in place.  Small bilateral pleural effusions are identified.  IMPRESSION: Mild to moderate left hydronephrosis with multiple nonobstructing stone seen in the left kidney. Cause for hydronephrosis is not identified.  Cortical thinning of the right kidney with increased echogenicity consistent with medical renal disease.  Small bilateral  pleural effusions.   Electronically Signed   By: Inge Rise M.D.   On: 07/31/2014 13:02   Medications: Infusions: .  sodium bicarbonate 150 mEq in sterile water 1000 mL infusion 100 mL/hr at 07/31/14 1603    Scheduled Medications: . antiseptic oral rinse  15 mL Mouth Rinse BID  . bicalutamide  50 mg Oral Daily  . cefTRIAXone (ROCEPHIN)  IV  1 g Intravenous Q24H  . cyanocobalamin  500 mcg Oral Daily  . feeding supplement (NEPRO CARB STEADY)  237 mL Oral TID WC  . ferrous fumarate  106 mg of iron Oral QPC breakfast  . insulin aspart  0-9 Units Subcutaneous TID WC  . senna  1 tablet Oral BID    have reviewed scheduled and prn medications.  Physical Exam: General: eyes closed, sitting on side of bed- very pleasant, no compliants Heart: RRR Lungs:  Mostly clear Abdomen: soft, non tender Extremities: no pitting edema    08/01/2014,11:41 AM  LOS: 2 days

## 2014-08-02 LAB — GLUCOSE, CAPILLARY
GLUCOSE-CAPILLARY: 143 mg/dL — AB (ref 70–99)
GLUCOSE-CAPILLARY: 161 mg/dL — AB (ref 70–99)
GLUCOSE-CAPILLARY: 140 mg/dL — AB (ref 70–99)
Glucose-Capillary: 139 mg/dL — ABNORMAL HIGH (ref 70–99)
Glucose-Capillary: 145 mg/dL — ABNORMAL HIGH (ref 70–99)

## 2014-08-02 LAB — COMPREHENSIVE METABOLIC PANEL
ALK PHOS: 103 U/L (ref 39–117)
ALT: 29 U/L (ref 0–53)
AST: 36 U/L (ref 0–37)
Albumin: 1.9 g/dL — ABNORMAL LOW (ref 3.5–5.2)
Anion gap: 11 (ref 5–15)
BUN: 67 mg/dL — AB (ref 6–23)
CO2: 31 mEq/L (ref 19–32)
Calcium: 7.6 mg/dL — ABNORMAL LOW (ref 8.4–10.5)
Chloride: 97 mEq/L (ref 96–112)
Creatinine, Ser: 2.84 mg/dL — ABNORMAL HIGH (ref 0.50–1.35)
GFR calc non Af Amer: 19 mL/min — ABNORMAL LOW (ref >90)
GFR, EST AFRICAN AMERICAN: 22 mL/min — AB (ref >90)
GLUCOSE: 136 mg/dL — AB (ref 70–99)
POTASSIUM: 3.4 meq/L — AB (ref 3.7–5.3)
Sodium: 139 mEq/L (ref 137–147)
Total Bilirubin: 0.2 mg/dL — ABNORMAL LOW (ref 0.3–1.2)
Total Protein: 5.5 g/dL — ABNORMAL LOW (ref 6.0–8.3)

## 2014-08-02 LAB — PHOSPHORUS: Phosphorus: 3.1 mg/dL (ref 2.3–4.6)

## 2014-08-02 LAB — CBC
HEMATOCRIT: 26.2 % — AB (ref 39.0–52.0)
HEMOGLOBIN: 8.9 g/dL — AB (ref 13.0–17.0)
MCH: 26.3 pg (ref 26.0–34.0)
MCHC: 34 g/dL (ref 30.0–36.0)
MCV: 77.3 fL — AB (ref 78.0–100.0)
Platelets: 55 10*3/uL — ABNORMAL LOW (ref 150–400)
RBC: 3.39 MIL/uL — ABNORMAL LOW (ref 4.22–5.81)
RDW: 13.9 % (ref 11.5–15.5)
WBC: 7.5 10*3/uL (ref 4.0–10.5)

## 2014-08-02 LAB — PSA: PSA: 4.46 ng/mL — AB (ref <=4.00)

## 2014-08-02 MED ORDER — CIPROFLOXACIN IN D5W 400 MG/200ML IV SOLN
400.0000 mg | INTRAVENOUS | Status: DC
  Administered 2014-08-02 – 2014-08-07 (×6): 400 mg via INTRAVENOUS
  Filled 2014-08-02 (×7): qty 200

## 2014-08-02 MED ORDER — DEXTROSE 5 % IV SOLN
INTRAVENOUS | Status: AC
  Administered 2014-08-02 – 2014-08-04 (×3): via INTRAVENOUS

## 2014-08-02 NOTE — Progress Notes (Signed)
[facility] PROGRESS NOTE  Luke Johnson LFY:[id number] DOB: Jun 10, 1926 DOA: 07/30/2014 PCP: [name], MD  Assessment/Plan: acute on chronic renal failure  -Baseline creatinine 1.9, 4.4 on admission 10/16 ,today is 2.84, trending down -Most likely related to Rhabdo ,volume depletion and dehydration, no evidence of urinary retention at this point.  -Unlikely HUS given no schistocytes on smear.  -Nephrology input is appreciated.  Acute encephalopathy  -Metabolic encephalopathy secondary to his UTI, worsening renal failure, no acute finding in CT head, no focal neurological deficit , HUS workup is negative.  -Continues to improve.  Thrombocytopenia  -Appreciate Heme input -Transitioned to cipro from rocephin per recs UTI  - Initially on Rocephin, urine culture growing Proteus, pan-sensitive - Now on cipro per above Hypertension  -Will hold all meds as blood pressure is on the lower side  Hyperglycemia  -Patient was noticed to have hyperglycemia at the skilled nursing facility.  -There is no official diagnosis of diabetes mellitus, hemoglobin A1c is 5.6, is on insulin sliding scale, but so far his glucose level are acceptable, so insulin sliding scale was discontinued 10/18.  Hyperlipidemia  -Holding statin due to Rhabdomyolysis .  Prostate cancer  -Continue with Casodex  Diarrhea  - C. Difficile neg   Code Status: Full Family Communication: Pt in room (indicate person spoken with, relationship, and if by phone, the number) Disposition Plan: Pending   Consultants:  Nephrology  Heme Onc    Procedures:  none  Antibiotics:  Rocephin 10/17>>>10/19  Ciprofloxacin 10/19>>>  HPI/Subjective: No acute events noted  Objective: Filed Vitals:   08/01/14 1900 08/01/14 2050 08/02/14 0513 08/02/14 1451  BP: 117/56 125/62 122/76 96/67  Pulse: 65 64 64 106  Temp: 98.2 F (36.8 C) 98.3 F (36.8 C) 98.9 F (37.2 C) 98.6 F (37 C)  TempSrc: Oral Oral Oral  Oral  Resp: 32 26 22 20   Height:      Weight:   75.5 kg (166 lb 7.2 oz)   SpO2: 97% 98% 96% 97%    Intake/Output Summary (Last 24 hours) at 08/02/14 1854 Last data filed at 08/02/14 1500  Gross per 24 hour  Intake   3340 ml  Output    600 ml  Net   2740 ml   Filed Weights   08/01/14 0423 08/02/14 0513  Weight: 75.5 kg (166 lb 7.2 oz) 75.5 kg (166 lb 7.2 oz)    Exam:   General:  Awake, in nad  Cardiovascular: regular, s1, s2  Respiratory: normal resp effort, no wheezing  Abdomen: soft,nondistended  Musculoskeletal: perfused, no clubbing   Data Reviewed: Basic Metabolic Panel:  Recent Labs Lab 07/31/14 0250 07/31/14 0649 07/31/14 1044 08/01/14 0536 08/02/14 0430  NA 139 141 136* 146 139  K 3.9 4.3 4.7 3.9 3.4*  CL 104 107 105 108 97  CO2 17* 17* 12* 22 31  GLUCOSE 102* 118* 115* 92 136*  BUN 65* 68* 67* 68* 67*  CREATININE 4.65* 4.83* 4.73* 3.94* 2.84*  CALCIUM 7.9* 7.8* 8.2* 7.8* 7.6*  PHOS  --   --   --  4.0 3.1   Liver Function Tests:  Recent Labs Lab 07/30/14 1337 08/01/14 0536 08/02/14 0430  AST 35  --  36  ALT 20  --  29  ALKPHOS 81  --  103  BILITOT 0.4  --  0.2*  PROT 6.9  --  5.5*  ALBUMIN 2.9* 2.0* 1.9*   No results found for this basename: LIPASE, AMYLASE,  in the last  168 hours No results found for this basename: AMMONIA,  in the last 168 hours CBC:  Recent Labs Lab 07/30/14 1337 07/31/14 0250 07/31/14 1512 08/01/14 0536 08/02/14 0430  WBC 17.7* 17.8*  --  16.9* 7.5  HGB 9.4* 8.9*  --  8.8* 8.9*  HCT 28.9* 26.8*  --  26.2* 26.2*  MCV 80.3 79.8  --  78.4 77.3*  PLT 146* 104* 84* 64* 55*   Cardiac Enzymes:  Recent Labs Lab 07/30/14 1353 07/30/14 1522  07/30/14 2250 07/31/14 0250 07/31/14 0649 07/31/14 1044 07/31/14 1333  CKTOTAL  --  754*  < > 1215* 1102* 990* 1160* 1102*  CKMB  --  4.7*  --   --   --   --   --   --   TROPONINI <0.30  --   --   --   --   --   --   --   < > = values in this interval not  displayed. BNP (last 3 results) No results found for this basename: PROBNP,  in the last 8760 hours CBG:  Recent Labs Lab 08/01/14 1616 08/02/14 0117 08/02/14 0801 08/02/14 1157 08/02/14 1647  GLUCAP 153* 143* 139* 161* 140*    Recent Results (from the past 240 hour(s))  URINE CULTURE     Status: None   Collection Time    07/30/14  1:52 PM      Result Value Ref Range Status   Specimen Description URINE, CLEAN CATCH   Final   Special Requests NONE   Final   Culture  Setup Time     Final   Value: 07/30/2014 21:45     Performed at [place]     Final   Value: >=100,000 COLONIES/ML     Performed at [facility]   Culture     Final   Value: PROTEUS MIRABILIS     Performed at [facility]   Report Status 08/01/2014 FINAL   Final   Organism ID, Bacteria PROTEUS MIRABILIS   Final  MRSA PCR SCREENING     Status: None   Collection Time    07/30/14 10:11 PM      Result Value Ref Range Status   MRSA by PCR NEGATIVE  NEGATIVE Final   Comment:            The GeneXpert MRSA Assay (FDA     approved for NASAL specimens     only), is one component of a     comprehensive MRSA colonization     surveillance program. It is not     intended to diagnose MRSA     infection nor to guide or     monitor treatment for     MRSA infections.  CLOSTRIDIUM DIFFICILE BY PCR     Status: None   Collection Time    08/01/14 11:40 AM      Result Value Ref Range Status   C difficile by pcr NEGATIVE  NEGATIVE Final   Comment: Performed at [facility]     Studies: Dg Chest Port 1 View  08/01/2014   CLINICAL DATA:  Shortness of breath.  EXAM: PORTABLE CHEST - 1 VIEW  COMPARISON:  PA and lateral chest 07/30/2014 and 01/09/2010.  FINDINGS: Large hiatal hernia is again seen. There is some left basilar atelectasis. No consolidative process, pneumothorax or effusion. Cardiomegaly noted.  IMPRESSION: Cardiomegaly without acute disease.  Hiatal hernia.    Electronically Signed  By: Inge Rise M.D.   On: 08/01/2014 15:21    Scheduled Meds: . antiseptic oral rinse  15 mL Mouth Rinse BID  . bicalutamide  50 mg Oral Daily  . ciprofloxacin  400 mg Intravenous Q24H  . cyanocobalamin  500 mcg Oral Daily  . feeding supplement (NEPRO CARB STEADY)  237 mL Oral TID WC  . ferrous fumarate  106 mg of iron Oral QPC breakfast  . insulin aspart  0-9 Units Subcutaneous TID WC  . senna  1 tablet Oral BID   Continuous Infusions: . dextrose 50 mL/hr at 08/02/14 1659    Active Problems:   Acute on chronic renal failure   Acute encephalopathy   UTI (lower urinary tract infection)   Prostate cancer   HTN (hypertension)   Dyslipidemia   Hyperglycemia   Anemia    Time spent: 55min    Markiya Keefe, [facility] Pager [phone]. If 7PM-7AM, please contact night-coverage at [email or web address], password [facility] 08/02/2014, 6:54 PM  LOS: 3 days

## 2014-08-02 NOTE — Progress Notes (Signed)
Physical Therapy Treatment Patient Details Name: Luke Johnson MRN: [id number] DOB: 04-09-1926 Today's Date: 08/02/2014    History of Present Illness Pt is an 80 y.o. male, with known past medical history of hypertension, blindness, malignant neoplasm of prostate, dyslipidemia, edema, anemia, DM type II admitted from a facility due found down on floor and hyperglycemia.    PT Comments    Pt in bed AxO x1.  Bed alarm on. Pt required increased time and MAX encouragement/coaxing to participate.  Assisted to EOB with HOB elevated and use of bed pad to scoot hips.  Once upright pt required MinAssist to prevent posterior LOB.  Pt sat EOB x 3 min as pt required MAX encouragement to amb.  "Where is my stick?".  Assuming walking stick.  Pt required redirection to current time and situation.  Assisted with amb limited distance due to mild level of lethargy and impaired cognition.  Had straight back chair follow for safety as pt reached outside of door way, request to sit.  Pt required hand over hand cueing due to impaired vision and cognition.  Pt sat in hallway x 7 min to rest before amb back to room then assisted back to bed.   At end of session, someone from [facility] arrived and stated pt was able to amb self all around unit and he was very familiar with his surroundings.    Follow Up Recommendations  Home health PT;SNF (pending progress and whether or not [facility] can take him back)     Equipment Recommendations       Recommendations for Other Services       Precautions / Restrictions Precautions Precautions: Fall Precaution Comments: legally blind    Mobility  Bed Mobility Overal bed mobility: Needs Assistance Bed Mobility: Supine to Sit;Sit to Supine     Supine to sit: Mod assist Sit to supine: Mod assist   General bed mobility comments: max encouragement and increased time  Transfers Overall transfer level: Needs assistance Equipment used: Rolling walker (2  wheeled) Transfers: Sit to/from Stand Sit to Stand: Mod assist;+2 safety/equipment         General transfer comment: verbal cues and tactile cues to facilitiate. Max encouragement/coaxing  Ambulation/Gait Ambulation/Gait assistance: Mod assist;Max assist Ambulation Distance (Feet): 22 Feet (11 feet x 2 one sitting rest break) Assistive device: Rolling walker (2 wheeled) Gait Pattern/deviations: Step-to pattern;Narrow base of support;Shuffle (severe posterior lean) HIGH FALL RISK. Gait velocity: decr   General Gait Details: assist to steady and negotiate RW for pt due to blindness.  Severe posterior lean and lateral instbility.  MAX encouragement to increase amb distance.   Stairs            Wheelchair Mobility    Modified Rankin (Stroke Patients Only)       Balance                                    Cognition Arousal/Alertness: Lethargic Behavior During Therapy: WFL for tasks assessed/performed Overall Cognitive Status: No family/caregiver present to determine baseline cognitive functioning                      Exercises      General Comments        Pertinent Vitals/Pain Pain Assessment: No/denies pain    Home Living  Prior Function            PT Goals (current goals can now be found in the care plan section) Progress towards PT goals: Progressing toward goals    Frequency  Min 3X/week    PT Plan      Co-evaluation             End of Session Equipment Utilized During Treatment: Gait belt Activity Tolerance: Other (comment) (AMS)       Time: 6384-5364 PT Time Calculation (min): 23 min  Charges:  $Gait Training: 8-22 mins $Therapeutic Activity: 8-22 mins                    G Codes:      [name]  PTA [facility]  Acute  Rehab Pager      [phone]

## 2014-08-02 NOTE — Progress Notes (Addendum)
ANTIBIOTIC CONSULT NOTE - INITIAL  Pharmacy Consult for Rocephin Indication: UTI  Assessment: 39 yoM with acute on chronic renal failure, acute encephalopathy, HTN. Pharmacy consulted to dose Rocephin for UTI. (First dose given in ER)  Tmax: AF WBCs: moderately elevated on admission, now WNL Renal: AKI on admission, now improving; CrCl 17 ml/min CG Experiencing 3rd degree heart block overnight, thought related to sepsis, although BPs WNL today  10/16 urine: > 100k proteus mirabilis; R only to nitrofurantoin.    Goal of Therapy:  Appropriate antibiotic dosing for renal function; eradication of infection  Plan:   Per hematology, concern for ceftriaxone as cause of new thrombocytopenia; requesting to change to fluoroquinolone if susceptible.  Will page MD requesting conversion to ciprofloxacin  F/u SCr    No Known Allergies  Patient Measurements: Height: 5\' 6"  (167.6 cm) Weight: 166 lb 7.2 oz (75.5 kg) IBW/kg (Calculated) : 63.8   Vital Signs: Temp: 98.9 F (37.2 C) (10/19 0513) Temp Source: Oral (10/19 0513) BP: 122/76 mmHg (10/19 0513) Pulse Rate: 64 (10/19 0513) Intake/Output from previous day: 10/18 0701 - 10/19 0700 In: 2020 [P.O.:1020; I.V.:1000] Out: 1000 [Urine:1000] Intake/Output from this shift: Total I/O In: 380 [P.O.:380] Out: -   Labs:  Recent Labs  07/31/14 0250  07/31/14 1044 07/31/14 1512 07/31/14 1759 08/01/14 0536 08/02/14 0430  WBC 17.8*  --   --   --   --  16.9* 7.5  HGB 8.9*  --   --   --   --  8.8* 8.9*  PLT 104*  --   --  84*  --  64* 55*  LABCREA  --   --   --   --  105.5  --   --   CREATININE 4.65*  < > 4.73*  --   --  3.94* 2.84*  < > = values in this interval not displayed. Estimated Creatinine Clearance: 17.2 ml/min (by C-G formula based on Cr of 2.84). No results found for this basename: Letta Median, VANCORANDOM, GENTTROUGH, GENTPEAK, GENTRANDOM, TOBRATROUGH, TOBRAPEAK, TOBRARND, AMIKACINPEAK, AMIKACINTROU,  AMIKACIN,  in the last 72 hours   Microbiology: Recent Results (from the past 720 hour(s))  URINE CULTURE     Status: None   Collection Time    07/30/14  1:52 PM      Result Value Ref Range Status   Specimen Description URINE, CLEAN CATCH   Final   Special Requests NONE   Final   Culture  Setup Time     Final   Value: 07/30/2014 21:45     Performed at [place]     Final   Value: >=100,000 COLONIES/ML     Performed at [facility]   Culture     Final   Value: PROTEUS MIRABILIS     Performed at [facility]   Report Status 08/01/2014 FINAL   Final   Organism ID, Bacteria PROTEUS MIRABILIS   Final  MRSA PCR SCREENING     Status: None   Collection Time    07/30/14 10:11 PM      Result Value Ref Range Status   MRSA by PCR NEGATIVE  NEGATIVE Final   Comment:            The GeneXpert MRSA Assay (FDA     approved for NASAL specimens     only), is one component of a     comprehensive MRSA colonization     surveillance program. It is not  intended to diagnose MRSA     infection nor to guide or     monitor treatment for     MRSA infections.  CLOSTRIDIUM DIFFICILE BY PCR     Status: None   Collection Time    08/01/14 11:40 AM      Result Value Ref Range Status   C difficile by pcr NEGATIVE  NEGATIVE Final   Comment: Performed at [place] History: Past Medical History  Diagnosis Date  . 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 Chronic kidney disease, stage III (moderate)   . Legal blindness, as defined in [place]   . Carcinoma in situ of prostate   . Anemia, unspecified   . Type II or unspecified type diabetes mellitus with renal manifestations, not stated as uncontrolled   . Other and unspecified hyperlipidemia   . Edema   . Obesity, unspecified   . Diabetes mellitus   . Dyslipidemia     Reuel Boom,  PharmD Pager: [phone] 08/02/2014, 11:11 AM

## 2014-08-02 NOTE — Progress Notes (Addendum)
Patient ID: Luke Johnson, male   DOB: September 22, 1926, 80 y.o.   MRN: [id number]    Subjective: Pt is well known to me and followed for biochemically recurrent prostate cancer managed with Casodex monotherapy.  I have reviewed his recent notes.  Renal function rapidly improving. Baseline Cr 1.9 from 2013. No fever overnight.  Denies flank pain.  Objective: Vital signs in last 24 hours: Temp:  [98.2 F (36.8 C)-98.9 F (37.2 C)] 98.9 F (37.2 C) (10/19 0513) Pulse Rate:  [64-65] 64 (10/19 0513) Resp:  [22-32] 22 (10/19 0513) BP: (117-125)/(56-76) 122/76 mmHg (10/19 0513) SpO2:  [96 %-98 %] 96 % (10/19 0513) Weight:  [75.5 kg (166 lb 7.2 oz)] 75.5 kg (166 lb 7.2 oz) (10/19 0513)  Intake/Output from previous day: 10/18 0701 - 10/19 0700 In: 2020 [P.O.:1020; I.V.:1000] Out: 1000 [Urine:1000] Intake/Output this shift: Total I/O In: 380 [P.O.:380] Out: -   Physical Exam:  General: Alert Abd : No CVAT  Lab Results:  Recent Labs  07/31/14 0250 08/01/14 0536 08/02/14 0430  HGB 8.9* 8.8* 8.9*  HCT 26.8* 26.2* 26.2*   BMET  Recent Labs  08/01/14 0536 08/02/14 0430  NA 146 139  K 3.9 3.4*  CL 108 97  CO2 22 31  GLUCOSE 92 136*  BUN 68* 67*  CREATININE 3.94* 2.84*  CALCIUM 7.8* 7.6*     Studies/Results: I reviewed ultrasound with mild left hydronephrosis noted.  Echogenic foci in left renal collecting system consistent with non-obstructing left renal calculi.  Assessment/Plan: 1) Biochemically recurrent prostate cancer: Continue Casodex 50 mg daily. 2) Left hydronephrosis/AKI/renal calculi: Recommend non-contrast CT scan stone study of abdomen and pelvis to evaluate for possible obstructing left ureteral calculus.  Renal function improving and so no urgent intervention required but he may require treatment or stent during hospitalization depending on findings. 3) UTI: Proteus UTI.  Continue ciprofloxacin.   LOS: 3 days   Onesha Krebbs,LES 08/02/2014, 2:46 PM

## 2014-08-02 NOTE — Progress Notes (Signed)
Occupational Therapy Treatment Patient Details Name: Luke Johnson MRN: [id number] DOB: 1926/09/04 Today's Date: 08/02/2014    History of present illness Pt is an 80 y.o. male, with known past medical history of hypertension, blindness, malignant neoplasm of prostate, dyslipidemia, edema, anemia, DM type II admitted from a facility due found down on floor and hyperglycemia.   OT comments  Pt lethargic during session and repeatedly stating, "I had my bath." When asked to transfer to EOB pt repeatedly stating "give me a minute" but then didn't initiate movement. At this time, feel unless ALF can provide at least mod assist care, pt will benefit from SNF at d/c.    Follow Up Recommendations  Supervision/Assistance - 24 hour;SNF    Equipment Recommendations  Other (comment) (defer to next venue)    Recommendations for Other Services      Precautions / Restrictions Precautions Precautions: Fall Precaution Comments: legally blind       Mobility Bed Mobility Overal bed mobility: Needs Assistance Bed Mobility: Supine to Sit     Supine to sit: Mod assist     General bed mobility comments: mod assist to facilitate and follow through as pt with decreased initiation; max increased time.  Transfers Overall transfer level: Needs assistance Equipment used: Rolling walker (2 wheeled) Transfers: Sit to/from Stand Sit to Stand: Mod assist;+2 safety/equipment         General transfer comment: verbal cues and tactile cues to facilitiate. Pt with decreased initiation.    Balance                                   ADL                       Lower Body Dressing: Total assistance;Sitting/lateral leans   Toilet Transfer: +2 for safety/equipment;Moderate assistance;RW;Stand-pivot             General ADL Comments: Pt appearing lethargic and requiring max increased time to complete all tasks. Pt repeatedly stating "give me a minute" but then wouldnt  initiate movement on his own. Needed max verbal and tactile cues to follow through. Pt with posterior lean at EOB despite multiple cues and facilitation to correct. Pt resistant to wearing socks and stating "I dont need them." Explained purpose of gripper socks and donned socks as pt still not really wanting to wear them but did allow OT to place on his feet. Lethargy limiting session.       Vision                     Perception     Praxis      Cognition   Behavior During Therapy: [facility] for tasks assessed/performed Overall Cognitive Status: No family/caregiver present to determine baseline cognitive functioning                       Extremity/Trunk Assessment               Exercises     Shoulder Instructions       General Comments      Pertinent Vitals/ Pain       Pain Assessment: No/denies pain  Home Living  Prior Functioning/Environment              Frequency Min 2X/week     Progress Toward Goals  OT Goals(current goals can now be found in the care plan section)  Progress towards OT goals: Not progressing toward goals - comment (pt lethargic)     Plan Discharge plan needs to be updated    Co-evaluation                 End of Session Equipment Utilized During Treatment: Gait belt;Rolling walker   Activity Tolerance Patient limited by lethargy   Patient Left in chair;with call bell/phone within reach;with chair alarm set   Nurse Communication          Time: 6384-5364 OT Time Calculation (min): 35 min  Charges: OT General Charges $OT Visit: 1 Procedure OT Treatments $Self Care/Home Management : 8-22 mins $Therapeutic Activity: 8-22 mins  Jules Schick [phone] 08/02/2014, 11:42 AM

## 2014-08-02 NOTE — Progress Notes (Signed)
  [facility] Progress Note   Subjective: No clear verbal response  Filed Vitals:   08/01/14 1900 08/01/14 2050 08/02/14 0513 08/02/14 1451  BP: 117/56 125/62 122/76 96/67  Pulse: 65 64 64 106  Temp: 98.2 F (36.8 C) 98.3 F (36.8 C) 98.9 F (37.2 C) 98.6 F (37 C)  TempSrc: Oral Oral Oral Oral  Resp: 32 $Remove'26 22 20  'puKEkOB$ Height:      Weight:   75.5 kg (166 lb 7.2 oz)   SpO2: 97% 98% 96% 97%   Exam: Elderly, eyes shut, follows simple commands No jvd Chest clear bilat Abd soft,NTND RRR no MRG Trace LE edema  Creat 2.8 today       Assessment: 1 Acute on CKD 4 - baseline creat 2.0; renal function improved, creat down to 2.8.  Not a long-term HD candidate d/t comorbidities.  2 Left hydro/ renal calculi - urology evaluating, CT ordered 3 Proteus UTI 4 Met acidosis - now alkalotic 5 Anemia 6 DM new dx 7 HTN holding meds d/t low BP, now BP better 8 Malnutrition 9 AMS - improved but still confused , not sure of baseline  Plan- stop bicarb gtt, no further suggestions. May need to address [place] with CKD stage IV and not a candidate for RRT. F/U should be with SNF PCP.  Will sign off, please call as needed.     Kelly Splinter MD  pager [phone]    cell [phone]  08/02/2014, 4:31 PM     Recent Labs Lab 07/31/14 1044 08/01/14 0536 08/02/14 0430  NA 136* 146 139  K 4.7 3.9 3.4*  CL 105 108 97  CO2 12* 22 31  GLUCOSE 115* 92 136*  BUN 67* 68* 67*  CREATININE 4.73* 3.94* 2.84*  CALCIUM 8.2* 7.8* 7.6*  PHOS  --  4.0 3.1    Recent Labs Lab 07/30/14 1337 08/01/14 0536 08/02/14 0430  AST 35  --  36  ALT 20  --  29  ALKPHOS 81  --  103  BILITOT 0.4  --  0.2*  PROT 6.9  --  5.5*  ALBUMIN 2.9* 2.0* 1.9*    Recent Labs Lab 07/31/14 0250 07/31/14 1512 08/01/14 0536 08/02/14 0430  WBC 17.8*  --  16.9* 7.5  HGB 8.9*  --  8.8* 8.9*  HCT 26.8*  --  26.2* 26.2*  MCV 79.8  --  78.4 77.3*  PLT 104* 84* 64* 55*   . antiseptic oral rinse  15 mL Mouth  Rinse BID  . bicalutamide  50 mg Oral Daily  . ciprofloxacin  400 mg Intravenous Q24H  . cyanocobalamin  500 mcg Oral Daily  . feeding supplement (NEPRO CARB STEADY)  237 mL Oral TID WC  . ferrous fumarate  106 mg of iron Oral QPC breakfast  . insulin aspart  0-9 Units Subcutaneous TID WC  . senna  1 tablet Oral BID   .  sodium bicarbonate 150 mEq in sterile water 1000 mL infusion 100 mL/hr at 08/02/14 1142   acetaminophen, acetaminophen, albuterol, bisacodyl, ondansetron (ZOFRAN) IV, ondansetron, polyethylene glycol

## 2014-08-03 ENCOUNTER — Inpatient Hospital Stay (HOSPITAL_COMMUNITY): Payer: Medicare Other

## 2014-08-03 ENCOUNTER — Encounter (HOSPITAL_COMMUNITY): Payer: Self-pay | Admitting: Radiology

## 2014-08-03 ENCOUNTER — Other Ambulatory Visit: Payer: Self-pay | Admitting: Urology

## 2014-08-03 DIAGNOSIS — D696 Thrombocytopenia, unspecified: Secondary | ICD-10-CM

## 2014-08-03 DIAGNOSIS — C61 Malignant neoplasm of prostate: Secondary | ICD-10-CM

## 2014-08-03 LAB — GLUCOSE, CAPILLARY
GLUCOSE-CAPILLARY: 174 mg/dL — AB (ref 70–99)
GLUCOSE-CAPILLARY: 153 mg/dL — AB (ref 70–99)
Glucose-Capillary: 164 mg/dL — ABNORMAL HIGH (ref 70–99)
Glucose-Capillary: 183 mg/dL — ABNORMAL HIGH (ref 70–99)
Glucose-Capillary: 143 mg/dL — ABNORMAL HIGH (ref 70–99)

## 2014-08-03 LAB — CBC
HEMATOCRIT: 26.7 % — AB (ref 39.0–52.0)
HEMOGLOBIN: 8.7 g/dL — AB (ref 13.0–17.0)
MCH: 26 pg (ref 26.0–34.0)
MCHC: 32.6 g/dL (ref 30.0–36.0)
MCV: 79.7 fL (ref 78.0–100.0)
Platelets: 52 10*3/uL — ABNORMAL LOW (ref 150–400)
RBC: 3.35 MIL/uL — AB (ref 4.22–5.81)
RDW: 13.9 % (ref 11.5–15.5)
WBC: 6.5 10*3/uL (ref 4.0–10.5)

## 2014-08-03 LAB — RENAL FUNCTION PANEL
ALBUMIN: 1.9 g/dL — AB (ref 3.5–5.2)
ANION GAP: 12 (ref 5–15)
BUN: 59 mg/dL — ABNORMAL HIGH (ref 6–23)
CALCIUM: 7.8 mg/dL — AB (ref 8.4–10.5)
CO2: 33 mEq/L — ABNORMAL HIGH (ref 19–32)
Chloride: 99 mEq/L (ref 96–112)
Creatinine, Ser: 2.61 mg/dL — ABNORMAL HIGH (ref 0.50–1.35)
GFR, EST AFRICAN AMERICAN: 24 mL/min — AB (ref >90)
GFR, EST NON AFRICAN AMERICAN: 21 mL/min — AB (ref >90)
Glucose, Bld: 169 mg/dL — ABNORMAL HIGH (ref 70–99)
PHOSPHORUS: 2.9 mg/dL (ref 2.3–4.6)
POTASSIUM: 3.3 meq/L — AB (ref 3.7–5.3)
SODIUM: 144 meq/L (ref 137–147)

## 2014-08-03 LAB — PROTEIN ELECTROPHORESIS, SERUM
Albumin ELP: 42.5 % — ABNORMAL LOW (ref 55.8–66.1)
Alpha-1-Globulin: 9.4 % — ABNORMAL HIGH (ref 2.9–4.9)
Alpha-2-Globulin: 16.6 % — ABNORMAL HIGH (ref 7.1–11.8)
Beta 2: 6.6 % — ABNORMAL HIGH (ref 3.2–6.5)
Beta Globulin: 4.3 % — ABNORMAL LOW (ref 4.7–7.2)
Gamma Globulin: 20.6 % — ABNORMAL HIGH (ref 11.1–18.8)
M-Spike, %: NOT DETECTED g/dL
Total Protein ELP: 4.9 g/dL — ABNORMAL LOW (ref 6.0–8.3)

## 2014-08-03 LAB — UIFE/LIGHT CHAINS/TP QN, 24-HR UR
ALBUMIN, U: DETECTED
ALPHA 2 UR: DETECTED — AB
Alpha 1, Urine: DETECTED — AB
BETA UR: DETECTED — AB
Gamma Globulin, Urine: DETECTED — AB
Total Protein, Urine: 177 mg/dL — ABNORMAL HIGH (ref 5–25)

## 2014-08-03 LAB — FERRITIN: FERRITIN: 1980 ng/mL — AB (ref 22–322)

## 2014-08-03 LAB — IRON AND TIBC
Iron: <10 ug/dL — ABNORMAL LOW (ref 42–135)
UIBC: 113 ug/dL — ABNORMAL LOW (ref 125–400)

## 2014-08-03 MED ORDER — VITAMINS A & D EX OINT
TOPICAL_OINTMENT | CUTANEOUS | Status: AC
  Administered 2014-08-03: 5
  Filled 2014-08-03: qty 5

## 2014-08-03 NOTE — Progress Notes (Signed)
Patient ID: Luke Johnson, male   DOB: 07/03/1926, 80 y.o.   MRN: [id number]  CT scan reviewed.  Pt has a 6 mm distal left ureteral stone as etiology for his left hydronephrosis.  Will plan to proceed with left ureteroscopic removal tomorrow if he does not pass stone.  Would recommend intervention rather than further observation considering current UTI.  Pt has been on appropriate antibiotic therapy and will plan for definitive stone removal tomorrow unless obvious purulence is noted at time of procedure which would result in change of procedure to simple stent placement. Pt has been informed and is agreeable to proceed as planned.

## 2014-08-03 NOTE — Progress Notes (Signed)
Patient ID: Luke Johnson, male   DOB: 07/18/1926, 80 y.o.   MRN: [id number]    Subjective: Pt without new complaints.  Still denies any flank pain.  Objective: Vital signs in last 24 hours: Temp:  [98.6 F (37 C)-100.3 F (37.9 C)] 100.1 F (37.8 C) (10/20 0439) Pulse Rate:  [60-106] 63 (10/20 0439) Resp:  [18-20] 18 (10/20 0439) BP: (96-128)/(44-67) 128/49 mmHg (10/20 0439) SpO2:  [96 %-97 %] 97 % (10/20 0439)  Intake/Output from previous day: 10/19 0701 - 10/20 0700 In: 2564.2 [P.O.:740; I.V.:1624.2; IV Piggyback:200] Out: 1300 [Urine:1300] Intake/Output this shift: Total I/O In: 530 [I.V.:530] Out: 1300 [Urine:1300]  Physical Exam:  Abd: No CVAT  Lab Results:  Recent Labs  08/01/14 0536 08/02/14 0430 08/03/14 0512  HGB 8.8* 8.9* 8.7*  HCT 26.2* 26.2* 26.7*   BMET  Recent Labs  08/02/14 0430 08/03/14 0512  NA 139 144  K 3.4* 3.3*  CL 97 99  CO2 31 33*  GLUCOSE 136* 169*  BUN 67* 59*  CREATININE 2.84* 2.61*  CALCIUM 7.6* 7.8*     Studies/Results:   Assessment/Plan: 1) Biochemically recurrent prostate cancer: Continue Casodex 50 mg daily. 2) Left hydronephrosis/AKI/renal calculi: Unenhanced CT of abdomen and pelvis has not been done yet.  Recommend that be done today to evaluate for possible left obstructing ureteral calculus as a potential source of left hydronephrosis.  Renal function continuing to gradually improve but there may be a component of obstruction as well. 3) UTI: Proteus UTI.  Continue ciprofloxacin.    LOS: 4 days   Wyndi Northrup,LES 08/03/2014, 6:45 AM

## 2014-08-03 NOTE — Progress Notes (Signed)
Order to obtain consent for patient to have possible cystoscopy, left ureteroscopic laser lithotripsey, and left ureteral stent placement. Patient is not alert or oriented to situation at this time. Patient's primary contact, Tommie L. Belenda Cruise, who is his cousin has given consent over the telephone for patient to have surgery tomorrow and was verified by 2 RNs. Will continue to monitor patient.

## 2014-08-03 NOTE — Progress Notes (Signed)
SUBJECTIVE: Patient is sleeping in no acute stress  OBJECTIVE PHYSICAL EXAMINATION: ECOG PERFORMANCE STATUS: 3 - Symptomatic, >50% confined to bed  Filed Vitals:   08/03/14 1438  BP: 119/49  Pulse: 66  Temp: 99.3 F (37.4 C)  Resp: 18   Filed Weights   08/01/14 0423 08/02/14 0513  Weight: 166 lb 7.2 oz (75.5 kg) 166 lb 7.2 oz (75.5 kg)   Skin: No petechia LUNGS: clear to auscultation and percussion with normal breathing effort HEART: regular rate & rhythm and no murmurs and no lower extremity edema ABDOMEN:abdomen soft, non-tender and normal bowel sounds Musculoskeletal:no cyanosis of digits and no clubbing   LABORATORY DATA:  I have reviewed the data as listed @LASTCHEMISTRY @  Lab Results  Component Value Date   WBC 6.5 08/03/2014   HGB 8.7* 08/03/2014   HCT 26.7* 08/03/2014   MCV 79.7 08/03/2014   PLT 52* 08/03/2014   NEUTROABS 3.3 01/09/2010    ASSESSMENT AND PLAN: 1. Persistent thrombocytopenia: I would recommend continued watchful monitoring of his platelet count without needing any intervention. I suspect it is multifactorial between the recent infection, drugs. His UTI is now being treated with ciprofloxacin. 2. normocytic anemia: Stable counts. Also multifactorial but most likely due to anemia of chronic kidney disease. Will follow

## 2014-08-03 NOTE — Progress Notes (Signed)
[facility] PROGRESS NOTE  Luke Johnson OHY:[id number] DOB: 07-30-1926 DOA: 07/30/2014 PCP: [name], MD brief narrative:  Luke Johnson is a 80 y.o. male, with known past medical history of hypertension, blindness, malignant neoplasm of prostate, dyslipidemia, edema, anemia, represents from skilled nursing facility as he was found on the floor, patient is a poor historian, can't provide any reliable history, usually he is oriented active, CT head,/CT cervical spine, did not show any acute finding or any.  Patient workup was significant for UTI, will leukocytosis of 17,000 ,started on Rocephin in ED, as well patient was found to be in acute on chronic renal failure last known creatinine 1.9, creatinine was 4.4 on admission, no urinary retention 150 cc on bladder scan.  Patient had elevated CK, secondary to Rhabdo, continues to have worsening a creatinine, renal were consulted, renal ultrasound showing mild left hydronephrosis with nonobstructive renal stones, patient's renal function continues to improve with IV hydration, but platelets continue to drop. Hematology is following for thrombocytopenia. Rocephin has since been changed to ciprofloxacin per Heme recs given concerns of possible drug induced thrombocytopenia.   Assessment/Plan: acute on chronic renal failure  -Baseline creatinine 1.9, 4.4 on admission 10/16 ,today is 2.84, trending down -Most likely related to Rhabdo ,volume depletion and dehydration, no evidence of urinary retention at this point.  -Unlikely HUS given no schistocytes on smear.  -Nephrology input is appreciated.  Acute encephalopathy  -Metabolic encephalopathy secondary to his UTI, worsening renal failure, no acute finding in CT head, no focal neurological deficit , HUS workup is negative.  -Slow improvement.  Thrombocytopenia  -Appreciate Heme input -Transitioned to cipro from rocephin per recs - Plts still trending down. Will follow UTI  - Initially on  Rocephin, urine culture growing Proteus, pan-sensitive - Now on cipro per above Hypertension  -BP meds were held secondary to soft BP  Hyperglycemia  -Patient was noticed to have hyperglycemia at the skilled nursing facility.  -There is no official diagnosis of diabetes mellitus, hemoglobin A1c is 5.6, is on insulin sliding scale, but so far his glucose level are acceptable, so insulin sliding scale was discontinued 10/18.  Hyperlipidemia  -Holding statin due to Rhabdomyolysis .  Prostate cancer  -Continue with Casodex  Diarrhea  - C. Difficile neg Hydronephrosis - Urology following - Recs for unenhanced CT abd of abd/pelvis to eval for possible L obstructing ureteral stone - Will order CT, pending Prostate Cancer - On Casodex  Code Status: Full Family Communication: Pt in room Disposition Plan: Pending   Consultants:  Nephrology  Heme Onc  Urology  Procedures:  none  Antibiotics:  Rocephin 10/17>>>10/19  Ciprofloxacin 10/19>>>  HPI/Subjective: Remains confused today. More conversant than yesterday  Objective: Filed Vitals:   08/02/14 0513 08/02/14 1451 08/02/14 2039 08/03/14 0439  BP: 122/76 96/67 120/44 128/49  Pulse: 64 106 60 63  Temp: 98.9 F (37.2 C) 98.6 F (37 C) 100.3 F (37.9 C) 100.1 F (37.8 C)  TempSrc: Oral Oral Oral Oral  Resp: 22 20 19 18   Height:      Weight: 75.5 kg (166 lb 7.2 oz)     SpO2: 96% 97% 96% 97%    Intake/Output Summary (Last 24 hours) at 08/03/14 1120 Last data filed at 08/03/14 0812  Gross per 24 hour  Intake 2421.16 ml  Output   1300 ml  Net 1121.16 ml   Filed Weights   08/01/14 0423 08/02/14 0513  Weight: 75.5 kg (166 lb 7.2 oz) 75.5 kg (166 lb  7.2 oz)    Exam:   General:  Awake, in nad  Cardiovascular: regular, s1, s2  Respiratory: normal resp effort, no wheezing  Abdomen: soft,nondistended  Musculoskeletal: perfused, no clubbing   Data Reviewed: Basic Metabolic Panel:  Recent Labs Lab  07/31/14 0649 07/31/14 1044 08/01/14 0536 08/02/14 0430 08/03/14 0512  NA 141 136* 146 139 144  K 4.3 4.7 3.9 3.4* 3.3*  CL 107 105 108 97 99  CO2 17* 12* 22 31 33*  GLUCOSE 118* 115* 92 136* 169*  BUN 68* 67* 68* 67* 59*  CREATININE 4.83* 4.73* 3.94* 2.84* 2.61*  CALCIUM 7.8* 8.2* 7.8* 7.6* 7.8*  PHOS  --   --  4.0 3.1 2.9   Liver Function Tests:  Recent Labs Lab 07/30/14 1337 08/01/14 0536 08/02/14 0430 08/03/14 0512  AST 35  --  36  --   ALT 20  --  29  --   ALKPHOS 81  --  103  --   BILITOT 0.4  --  0.2*  --   PROT 6.9  --  5.5*  --   ALBUMIN 2.9* 2.0* 1.9* 1.9*   No results found for this basename: LIPASE, AMYLASE,  in the last 168 hours No results found for this basename: AMMONIA,  in the last 168 hours CBC:  Recent Labs Lab 07/30/14 1337 07/31/14 0250 07/31/14 1512 08/01/14 0536 08/02/14 0430 08/03/14 0512  WBC 17.7* 17.8*  --  16.9* 7.5 6.5  HGB 9.4* 8.9*  --  8.8* 8.9* 8.7*  HCT 28.9* 26.8*  --  26.2* 26.2* 26.7*  MCV 80.3 79.8  --  78.4 77.3* 79.7  PLT 146* 104* 84* 64* 55* 52*   Cardiac Enzymes:  Recent Labs Lab 07/30/14 1353 07/30/14 1522  07/30/14 2250 07/31/14 0250 07/31/14 0649 07/31/14 1044 07/31/14 1333  CKTOTAL  --  754*  < > 1215* 1102* 990* 1160* 1102*  CKMB  --  4.7*  --   --   --   --   --   --   TROPONINI <0.30  --   --   --   --   --   --   --   < > = values in this interval not displayed. BNP (last 3 results) No results found for this basename: PROBNP,  in the last 8760 hours CBG:  Recent Labs Lab 08/02/14 0801 08/02/14 1157 08/02/14 1647 08/02/14 2146 08/03/14 0732  GLUCAP 139* 161* 140* 145* 164*    Recent Results (from the past 240 hour(s))  URINE CULTURE     Status: None   Collection Time    07/30/14  1:52 PM      Result Value Ref Range Status   Specimen Description URINE, CLEAN CATCH   Final   Special Requests NONE   Final   Culture  Setup Time     Final   Value: 07/30/2014 21:45     Performed at  [facility]     Final   Value: >=100,000 COLONIES/ML     Performed at [facility]   Culture     Final   Value: PROTEUS MIRABILIS     Performed at [facility]   Report Status 08/01/2014 FINAL   Final   Organism ID, Bacteria PROTEUS MIRABILIS   Final  MRSA PCR SCREENING     Status: None   Collection Time    07/30/14 10:11 PM      Result Value Ref Range  Status   MRSA by PCR NEGATIVE  NEGATIVE Final   Comment:            The GeneXpert MRSA Assay (FDA     approved for NASAL specimens     only), is one component of a     comprehensive MRSA colonization     surveillance program. It is not     intended to diagnose MRSA     infection nor to guide or     monitor treatment for     MRSA infections.  CLOSTRIDIUM DIFFICILE BY PCR     Status: None   Collection Time    08/01/14 11:40 AM      Result Value Ref Range Status   C difficile by pcr NEGATIVE  NEGATIVE Final   Comment: Performed at [facility]     Studies: Dg Chest Port 1 View  08/01/2014   CLINICAL DATA:  Shortness of breath.  EXAM: PORTABLE CHEST - 1 VIEW  COMPARISON:  PA and lateral chest 07/30/2014 and 01/09/2010.  FINDINGS: Large hiatal hernia is again seen. There is some left basilar atelectasis. No consolidative process, pneumothorax or effusion. Cardiomegaly noted.  IMPRESSION: Cardiomegaly without acute disease.  Hiatal hernia.   Electronically Signed   By: Inge Rise M.D.   On: 08/01/2014 15:21    Scheduled Meds: . antiseptic oral rinse  15 mL Mouth Rinse BID  . bicalutamide  50 mg Oral Daily  . ciprofloxacin  400 mg Intravenous Q24H  . cyanocobalamin  500 mcg Oral Daily  . feeding supplement (NEPRO CARB STEADY)  237 mL Oral TID WC  . ferrous fumarate  106 mg of iron Oral QPC breakfast  . insulin aspart  0-9 Units Subcutaneous TID WC  . senna  1 tablet Oral BID   Continuous Infusions: . dextrose 50 mL/hr at 08/02/14 1659    Active Problems:   Acute on  chronic renal failure   Acute encephalopathy   UTI (lower urinary tract infection)   Prostate cancer   HTN (hypertension)   Dyslipidemia   Hyperglycemia   Anemia    Time spent: 68min    Whitleigh Garramone, [facility] Pager [phone]. If 7PM-7AM, please contact night-coverage at [email or web address], password [facility] 08/03/2014, 11:20 AM  LOS: 4 days

## 2014-08-04 ENCOUNTER — Inpatient Hospital Stay (HOSPITAL_COMMUNITY): Payer: Medicare Other | Admitting: Certified Registered Nurse Anesthetist

## 2014-08-04 ENCOUNTER — Encounter (HOSPITAL_COMMUNITY)
Admission: EM | Disposition: A | Discharge status: Skilled Nursing Facility | Payer: Self-pay | Source: Home / Self Care | Attending: Internal Medicine

## 2014-08-04 ENCOUNTER — Encounter (HOSPITAL_COMMUNITY): Payer: Medicare Other | Admitting: Certified Registered Nurse Anesthetist

## 2014-08-04 DIAGNOSIS — N132 Hydronephrosis with renal and ureteral calculous obstruction: Secondary | ICD-10-CM

## 2014-08-04 HISTORY — PX: CYSTOSCOPY/RETROGRADE/URETEROSCOPY/STONE EXTRACTION WITH BASKET: SHX5317

## 2014-08-04 LAB — CBC
HEMATOCRIT: 26.7 % — AB (ref 39.0–52.0)
Hemoglobin: 8.8 g/dL — ABNORMAL LOW (ref 13.0–17.0)
MCH: 25.8 pg — ABNORMAL LOW (ref 26.0–34.0)
MCHC: 33 g/dL (ref 30.0–36.0)
MCV: 78.3 fL (ref 78.0–100.0)
Platelets: 63 10*3/uL — ABNORMAL LOW (ref 150–400)
RBC: 3.41 MIL/uL — ABNORMAL LOW (ref 4.22–5.81)
RDW: 14 % (ref 11.5–15.5)
WBC: 8.2 10*3/uL (ref 4.0–10.5)

## 2014-08-04 LAB — GLUCOSE, CAPILLARY
Glucose-Capillary: 149 mg/dL — ABNORMAL HIGH (ref 70–99)
Glucose-Capillary: 138 mg/dL — ABNORMAL HIGH (ref 70–99)
Glucose-Capillary: 194 mg/dL — ABNORMAL HIGH (ref 70–99)
Glucose-Capillary: 198 mg/dL — ABNORMAL HIGH (ref 70–99)

## 2014-08-04 LAB — RENAL FUNCTION PANEL
ALBUMIN: 1.8 g/dL — AB (ref 3.5–5.2)
Anion gap: 9 (ref 5–15)
BUN: 53 mg/dL — ABNORMAL HIGH (ref 6–23)
CALCIUM: 7.7 mg/dL — AB (ref 8.4–10.5)
CHLORIDE: 96 meq/L (ref 96–112)
CO2: 33 meq/L — AB (ref 19–32)
CREATININE: 2.29 mg/dL — AB (ref 0.50–1.35)
GFR calc Af Amer: 28 mL/min — ABNORMAL LOW (ref >90)
GFR, EST NON AFRICAN AMERICAN: 24 mL/min — AB (ref >90)
Glucose, Bld: 155 mg/dL — ABNORMAL HIGH (ref 70–99)
Phosphorus: 2.6 mg/dL (ref 2.3–4.6)
Potassium: 3.1 mEq/L — ABNORMAL LOW (ref 3.7–5.3)
SODIUM: 138 meq/L (ref 137–147)

## 2014-08-04 LAB — SURGICAL PCR SCREEN
MRSA, PCR: NEGATIVE
Staphylococcus aureus: POSITIVE — AB

## 2014-08-04 LAB — HEPARIN INDUCED THROMBOCYTOPENIA PNL
Heparin Induced Plt Ab: NEGATIVE
Patient O.D.: 0.122
UFH HIGH DOSE UFH H: 0 %
UFH LOW DOSE 0.1 IU/ML: 0 %
UFH LOW DOSE 0.5 IU/ML: 0 %
UFH SRA RESULT: NEGATIVE

## 2014-08-04 SURGERY — CYSTOSCOPY, WITH CALCULUS REMOVAL USING BASKET
Anesthesia: General | Laterality: Left

## 2014-08-04 MED ORDER — EPHEDRINE SULFATE 50 MG/ML IJ SOLN
INTRAMUSCULAR | Status: DC | PRN
  Administered 2014-08-04 (×2): 5 mg via INTRAVENOUS
  Administered 2014-08-04: 10 mg via INTRAVENOUS

## 2014-08-04 MED ORDER — ONDANSETRON HCL 4 MG/2ML IJ SOLN
INTRAMUSCULAR | Status: AC
  Filled 2014-08-04: qty 2

## 2014-08-04 MED ORDER — FENTANYL CITRATE 0.05 MG/ML IJ SOLN: 25.0000 ug | INTRAMUSCULAR | Status: DC | PRN

## 2014-08-04 MED ORDER — ONDANSETRON HCL 4 MG/2ML IJ SOLN
INTRAMUSCULAR | Status: DC | PRN
  Administered 2014-08-04: 4 mg via INTRAVENOUS

## 2014-08-04 MED ORDER — EPHEDRINE SULFATE 50 MG/ML IJ SOLN
INTRAMUSCULAR | Status: AC
  Filled 2014-08-04: qty 1

## 2014-08-04 MED ORDER — SODIUM CHLORIDE 0.9 % IJ SOLN
INTRAMUSCULAR | Status: AC
  Filled 2014-08-04: qty 10

## 2014-08-04 MED ORDER — PROPOFOL 10 MG/ML IV BOLUS
INTRAVENOUS | Status: DC | PRN
  Administered 2014-08-04: 120 mg via INTRAVENOUS
  Administered 2014-08-04 (×2): 40 mg via INTRAVENOUS

## 2014-08-04 MED ORDER — FENTANYL CITRATE 0.05 MG/ML IJ SOLN
INTRAMUSCULAR | Status: DC | PRN
  Administered 2014-08-04 (×2): 25 ug via INTRAVENOUS

## 2014-08-04 MED ORDER — FENTANYL CITRATE 0.05 MG/ML IJ SOLN
INTRAMUSCULAR | Status: AC
  Filled 2014-08-04: qty 2

## 2014-08-04 MED ORDER — POTASSIUM CHLORIDE CRYS ER 20 MEQ PO TBCR
40.0000 meq | EXTENDED_RELEASE_TABLET | Freq: Once | ORAL | Status: AC
  Administered 2014-08-04: 40 meq via ORAL
  Filled 2014-08-04: qty 2

## 2014-08-04 MED ORDER — SODIUM CHLORIDE 0.9 % IV SOLN: INTRAVENOUS | Status: DC

## 2014-08-04 MED ORDER — SIMVASTATIN 20 MG PO TABS
20.0000 mg | ORAL_TABLET | Freq: Every day | ORAL | Status: DC
  Administered 2014-08-05 – 2014-08-07 (×3): 20 mg via ORAL
  Filled 2014-08-04 (×4): qty 1

## 2014-08-04 MED ORDER — LIDOCAINE HCL (CARDIAC) 20 MG/ML IV SOLN
INTRAVENOUS | Status: AC
  Filled 2014-08-04: qty 5

## 2014-08-04 MED ORDER — IOHEXOL 300 MG/ML  SOLN
INTRAMUSCULAR | Status: DC | PRN
  Administered 2014-08-04: 10 mL

## 2014-08-04 MED ORDER — ATROPINE SULFATE 0.4 MG/ML IJ SOLN
INTRAMUSCULAR | Status: AC
  Filled 2014-08-04: qty 1

## 2014-08-04 MED ORDER — PROMETHAZINE HCL 25 MG/ML IJ SOLN: 6.2500 mg | INTRAMUSCULAR | Status: DC | PRN

## 2014-08-04 MED ORDER — SODIUM CHLORIDE 0.9 % IV SOLN
INTRAVENOUS | Status: DC | PRN
  Administered 2014-08-04: 15:00:00 via INTRAVENOUS

## 2014-08-04 MED ORDER — CIPROFLOXACIN IN D5W 400 MG/200ML IV SOLN
INTRAVENOUS | Status: AC
  Filled 2014-08-04: qty 200

## 2014-08-04 MED ORDER — PROPOFOL 10 MG/ML IV BOLUS
INTRAVENOUS | Status: AC
  Filled 2014-08-04: qty 20

## 2014-08-04 MED ORDER — LIDOCAINE HCL (CARDIAC) 20 MG/ML IV SOLN
INTRAVENOUS | Status: DC | PRN
  Administered 2014-08-04: 80 mg via INTRAVENOUS

## 2014-08-04 MED ORDER — MEPERIDINE HCL 50 MG/ML IJ SOLN: 6.2500 mg | INTRAMUSCULAR | Status: DC | PRN

## 2014-08-04 SURGICAL SUPPLY — 19 items
BAG URO CATCHER STRL LF (DRAPE) ×3 IMPLANT
BASKET ZERO TIP NITINOL 2.4FR (BASKET) IMPLANT
BSKT STON RTRVL ZERO TP 2.4FR (BASKET)
CATH INTERMIT  6FR 70CM (CATHETERS) ×2 IMPLANT
CLOTH BEACON ORANGE TIMEOUT ST (SAFETY) ×3 IMPLANT
DRAPE CAMERA CLOSED 9X96 (DRAPES) ×3 IMPLANT
FIBER LASER FLEXIVA 1000 (UROLOGICAL SUPPLIES) ×1 IMPLANT
FIBER LASER FLEXIVA 200 (UROLOGICAL SUPPLIES) ×1 IMPLANT
FIBER LASER FLEXIVA 365 (UROLOGICAL SUPPLIES) ×1 IMPLANT
FIBER LASER FLEXIVA 550 (UROLOGICAL SUPPLIES) ×1 IMPLANT
FIBER LASER TRAC TIP (UROLOGICAL SUPPLIES) ×1 IMPLANT
GLOVE BIOGEL M STRL SZ7.5 (GLOVE) ×3 IMPLANT
GOWN STRL REUS W/TWL LRG LVL3 (GOWN DISPOSABLE) ×6 IMPLANT
GUIDEWIRE ANG ZIPWIRE 038X150 (WIRE) IMPLANT
GUIDEWIRE STR DUAL SENSOR (WIRE) ×3 IMPLANT
MANIFOLD NEPTUNE II (INSTRUMENTS) ×3 IMPLANT
PACK CYSTO (CUSTOM PROCEDURE TRAY) ×3 IMPLANT
TUBING CONNECTING 10 (TUBING) ×2 IMPLANT
TUBING CONNECTING 10' (TUBING) ×1

## 2014-08-04 NOTE — Progress Notes (Signed)
[facility] PROGRESS NOTE  Luke Johnson XKG:[id number] DOB: Apr 23, 1926 DOA: 07/30/2014 PCP: [name], MD  Assessment/Plan  Acute on chronic renal failure, baseline creatinine 1.9, 4.4 on admission and trending down  -Most likely related to volume depletion and dehydration, no evidence of urinary retention at this point.  -Unlikely HUS given no schistocytes on smear.  -Appreciate Nephrology assistance  Acute encephalopathy  -Metabolic encephalopathy secondary to his UTI, worsening renal failure, no acute finding in CT head, no focal neurological deficit , HUS workup is negative.  -Slow improvement.   Thrombocytopenia  -Appreciate Heme input  -Transitioned to cipro from rocephin per recs  - Plts trending up slightly  UTI  - Initially on Rocephin, urine culture growing Proteus, pan-sensitive  - Now on cipro per above   Hypertension  -BP meds were held secondary to soft BP   Hyperglycemia  -Patient was noticed to have hyperglycemia at the skilled nursing facility.  -There is no official diagnosis of diabetes mellitus, hemoglobin A1c is 5.6, is on insulin sliding scale, but so far his glucose level are acceptable, so insulin sliding scale was discontinued 10/18.   Hyperlipidemia, resume statin  Prostate cancer, stable, continue Casodex   Diarrhea, C. Difficile neg   Hydronephrosis, CT scan demonstrated left hydronephrosis and hydroureter secondary to a 6 mm stone at the left UVJ, a large left renal calculus of 2 cm in diameter -  Patient underwent cystoscopy with left ureteroscopy and stone removal with left retrograde pyelography by Doctor [name] on 10/21 -  Appreciate urology assistance  Prostate Cancer  - On Casodex  Diet: renal Access:  PIV IVF:  yes Proph:  SCD  Code Status: full code Family Communication: patient alone Disposition Plan:  Post treatment for kidney stone to ALF  Consultants:  Nephrology  Heme Onc  Urology Procedures:   none Antibiotics:  Rocephin 10/17>>>10/19  Ciprofloxacin 10/19>>>  HPI/Subjective:  Very confused.  Asking for his bag of stuff.      Objective: Filed Vitals:   08/04/14 1336 08/04/14 1615 08/04/14 1645 08/04/14 1703  BP: 119/48 119/43  128/45  Pulse: 59 70  60  Temp: 99.1 F (37.3 C) 97.9 F (36.6 C) 98 F (36.7 C) 97.8 F (36.6 C)  TempSrc: Axillary     Resp: 18 35  12  Height:      Weight:      SpO2: 98%   100%    Intake/Output Summary (Last 24 hours) at 08/04/14 1819 Last data filed at 08/04/14 1800  Gross per 24 hour  Intake   1100 ml  Output   1500 ml  Net   -400 ml   Filed Weights   08/01/14 0423 08/02/14 0513  Weight: 75.5 kg (166 lb 7.2 oz) 75.5 kg (166 lb 7.2 oz)    Exam:   General:  AAM, No acute distress, refuses to open eyes,   HEENT:  NCAT, MMM  Cardiovascular:  RRR, nl S1, S2 no mrg, 2+ pulses, warm extremities  Respiratory:  CTAB, no increased WOB  Abdomen:   NABS, soft, NT/ND  MSK:   Normal tone and bulk, no LEE  Neuro:  Diffusely weak  Data Reviewed: Basic Metabolic Panel:  Recent Labs Lab 07/31/14 1044 08/01/14 0536 08/02/14 0430 08/03/14 0512 08/04/14 0408  NA 136* 146 139 144 138  K 4.7 3.9 3.4* 3.3* 3.1*  CL 105 108 97 99 96  CO2 12* 22 31 33* 33*  GLUCOSE 115* 92 136* 169* 155*  BUN  67* 68* 67* 59* 53*  CREATININE 4.73* 3.94* 2.84* 2.61* 2.29*  CALCIUM 8.2* 7.8* 7.6* 7.8* 7.7*  PHOS  --  4.0 3.1 2.9 2.6   Liver Function Tests:  Recent Labs Lab 07/30/14 1337 08/01/14 0536 08/02/14 0430 08/03/14 0512 08/04/14 0408  AST 35  --  36  --   --   ALT 20  --  29  --   --   ALKPHOS 81  --  103  --   --   BILITOT 0.4  --  0.2*  --   --   PROT 6.9  --  5.5*  --   --   ALBUMIN 2.9* 2.0* 1.9* 1.9* 1.8*   No results found for this basename: LIPASE, AMYLASE,  in the last 168 hours No results found for this basename: AMMONIA,  in the last 168 hours CBC:  Recent Labs Lab 07/31/14 0250 07/31/14 1512 08/01/14 0536  08/02/14 0430 08/03/14 0512 08/04/14 0408  WBC 17.8*  --  16.9* 7.5 6.5 8.2  HGB 8.9*  --  8.8* 8.9* 8.7* 8.8*  HCT 26.8*  --  26.2* 26.2* 26.7* 26.7*  MCV 79.8  --  78.4 77.3* 79.7 78.3  PLT 104* 84* 64* 55* 52* 63*   Cardiac Enzymes:  Recent Labs Lab 07/30/14 1353 07/30/14 1522  07/30/14 2250 07/31/14 0250 07/31/14 0649 07/31/14 1044 07/31/14 1333  CKTOTAL  --  754*  < > 1215* 1102* 990* 1160* 1102*  CKMB  --  4.7*  --   --   --   --   --   --   TROPONINI <0.30  --   --   --   --   --   --   --   < > = values in this interval not displayed. BNP (last 3 results) No results found for this basename: PROBNP,  in the last 8760 hours CBG:  Recent Labs Lab 08/03/14 1706 08/03/14 2207 08/04/14 0734 08/04/14 1150 08/04/14 1721  GLUCAP 153* 143* 149* 138* 194*    Recent Results (from the past 240 hour(s))  URINE CULTURE     Status: None   Collection Time    07/30/14  1:52 PM      Result Value Ref Range Status   Specimen Description URINE, CLEAN CATCH   Final   Special Requests NONE   Final   Culture  Setup Time     Final   Value: 07/30/2014 21:45     Performed at [place] [facility]     Final   Value: >=100,000 COLONIES/ML     Performed at [facility]   Culture     Final   Value: PROTEUS MIRABILIS     Performed at [facility]   Report Status 08/01/2014 FINAL   Final   Organism ID, Bacteria PROTEUS MIRABILIS   Final  MRSA PCR SCREENING     Status: None   Collection Time    07/30/14 10:11 PM      Result Value Ref Range Status   MRSA by PCR NEGATIVE  NEGATIVE Final   Comment:            The GeneXpert MRSA Assay (FDA     approved for NASAL specimens     only), is one component of a     comprehensive MRSA colonization     surveillance program. It is not     intended to diagnose MRSA     infection  nor to guide or     monitor treatment for     MRSA infections.  CLOSTRIDIUM DIFFICILE BY PCR     Status: None   Collection Time     08/01/14 11:40 AM      Result Value Ref Range Status   C difficile by pcr NEGATIVE  NEGATIVE Final   Comment: Performed at [facility]     Studies: Ct Abdomen Pelvis Wo Contrast  08/03/2014   CLINICAL DATA:  LEFT ureteral calculus with hydronephrosis, personal history of prostate cancer with seed implantation, UTI, hypertensive heart and kidney disease, type 2 diabetes mellitus, hypertension  EXAM: CT ABDOMEN AND PELVIS WITHOUT CONTRAST  TECHNIQUE: Multidetector CT imaging of the abdomen and pelvis was performed following the standard protocol without IV contrast. Sagittal and coronal MPR images reconstructed from axial data set. Neither ulnar injury neck is contrast administered for this indication.  COMPARISON:  09/28/2010  FINDINGS: Bibasilar pleural effusions and atelectasis.  Significant BILATERAL gynecomastia  Atherosclerotic calcifications aorta and coronary arteries.  Nonobstructing large LEFT renal calculus up to 2 cm diameter.  LEFT hydronephrosis and hydroureter secondary to a 6 mm calculus at the LEFT ureterovesical junction.  Bladder decompressed by Foley catheter.  No RIGHT hydronephrosis or urinary tract calcification.  Within limits of a nonenhanced exam, liver, spleen, pancreas, RIGHT kidney, and adrenal glands unremarkable.  Large hiatal hernia.  Small ventral hernia in anterior inferior pelvis RIGHT of midline with herniation of fat into the subcutaneous soft tissue.  Normal appendix.  Stomach and bowel loops otherwise unremarkable for technique.  No additional mass, adenopathy, or free fluid.  Brachytherapy seed implants at prostate bed.  Mild stranding of the presacral space increased since previous exam.  Degenerative changes of both hips and scattered lumbar disc space levels.  IMPRESSION: LEFT hydronephrosis and hydroureter secondary to a 6 mm LEFT UVJ calculus.  Large LEFT renal calculus up to 2 cm diameter.  BILATERAL pleural effusions and atelectasis.  Significant  BILATERAL gynecomastia; correlation with physical exam recommended to exclude mass.  Large hiatal hernia.  Small anterior inferior RIGHT pelvic ventral hernia containing fat.   Electronically Signed   By: Lavonia Dana M.D.   On: 08/03/2014 14:00    Scheduled Meds: . antiseptic oral rinse  15 mL Mouth Rinse BID  . bicalutamide  50 mg Oral Daily  . ciprofloxacin  400 mg Intravenous Q24H  . cyanocobalamin  500 mcg Oral Daily  . feeding supplement (NEPRO CARB STEADY)  237 mL Oral TID WC  . ferrous fumarate  106 mg of iron Oral QPC breakfast  . insulin aspart  0-9 Units Subcutaneous TID WC  . senna  1 tablet Oral BID   Continuous Infusions:   Active Problems:   Acute on chronic renal failure   Acute encephalopathy   UTI (lower urinary tract infection)   Prostate cancer   HTN (hypertension)   Dyslipidemia   Hyperglycemia   Anemia    Time spent: 30 min    Shirely Toren, [facility] Pager [phone]. If 7PM-7AM, please contact night-coverage at [email or web address], password [facility] 08/04/2014, 6:19 PM  LOS: 5 days

## 2014-08-04 NOTE — Anesthesia Postprocedure Evaluation (Signed)
  Anesthesia Post-op Note  Patient: Luke Johnson  Procedure(s) Performed: Procedure(s) (LRB): CYSTOSCOPY/RETROGRADE/URETEROSCOPY/STONE EXTRACTION WITH BASKET (Left)  Patient Location: PACU  Anesthesia Type: General  Level of Consciousness: awake and alert   Airway and Oxygen Therapy: Patient Spontanous Breathing  Post-op Pain: mild  Post-op Assessment: Post-op Vital signs reviewed, Patient's Cardiovascular Status Stable, Respiratory Function Stable, Patent Airway and No signs of Nausea or vomiting  Last Vitals:  Filed Vitals:   08/04/14 1703  BP: 128/45  Pulse: 60  Temp: 36.6 C  Resp: 12    Post-op Vital Signs: stable   Complications: No apparent anesthesia complications

## 2014-08-04 NOTE — Op Note (Signed)
Preoperative diagnosis: Left ureteral calculus  Postoperative diagnosis: Left ureteral calculus  Procedure:  1. Cystoscopy 2. Left ureteroscopy and stone removal 3. Left retrograde pyelography with interpretation  Surgeon: Pryor Curia. M.D.  Anesthesia: General  Complications: None  Intraoperative findings: Left retrograde pyelography demonstrated a filling defect within the distal left ureter consistent with the patient's known calculus and filling defect in the renal collecting system consistent with known renal calculi without other abnormalities.  EBL: Minimal  Specimens: 1. Left ureteral calculus  Disposition of specimens: [facility] for stone analysis  Indication: Luke Johnson is a 80 y.o. year old patient with urolithiasis noted to have a Proteus UTI and left ureteral obstruction.  He has been treated with Cipro and has been afebrile. His renal function has been improving. After reviewing the management options for treatment, the patient elected to proceed with the above surgical procedure(s). We have discussed the potential benefits and risks of the procedure, side effects of the proposed treatment, the likelihood of the patient achieving the goals of the procedure, and any potential problems that might occur during the procedure or recuperation. Informed consent has been obtained.  Description of procedure:  The patient was taken to the operating room and general anesthesia was induced.  The patient was placed in the dorsal lithotomy position, prepped and draped in the usual sterile fashion, and preoperative antibiotics were administered. A preoperative time-out was performed.   Cystourethroscopy was performed.  The patient's urethra was examined and was normal. The bladder was then systematically examined in its entirety. There was no evidence for any bladder tumors, stones, or other mucosal pathology.    Attention then turned to the left  ureteral orifice which was edematous and a ureteral catheter was used to intubate the ureteral orifice.  Omnipaque contrast was injected through the ureteral catheter and a retrograde pyelogram was performed with findings as dictated above.  A 0.38 sensor guidewire was then advanced up the left ureter into the renal pelvis under fluoroscopic guidance. The 6 Fr semirigid ureteroscope was then advanced into the ureter next to the guidewire and the calculus was identified. The left ureteral stone was able to be manipulated out of the left ureter simply with removal of the ureteroscope.  Reinspection of the ureter revealed no remaining visible stones or fragments.   The stone was then removed from the bladder via the cystoscope which was inserted after the ureteroscope had been removed.  No stent was felt to be necessary.  The bladder was then emptied and the procedure ended.  The patient appeared to tolerate the procedure well and without complications.  The patient was able to be awakened and transferred to the recovery unit in satisfactory condition.

## 2014-08-04 NOTE — Progress Notes (Signed)
Patient ID: Luke Johnson, male   DOB: 08/23/1926, 80 y.o.   MRN: [id number]    Subjective: Pt with distal left ureteral stone noted yesterday.  Pt has not passed stone.  Still denies pain.  Objective: Vital signs in last 24 hours: Temp:  [98.4 F (36.9 C)-101.3 F (38.5 C)] 98.4 F (36.9 C) (10/21 0650) Pulse Rate:  [64-66] 64 (10/21 0454) Resp:  [18-19] 19 (10/21 0454) BP: (119-134)/(43-49) 134/48 mmHg (10/21 0454) SpO2:  [95 %-97 %] 95 % (10/21 0454)  Intake/Output from previous day: 10/20 0701 - 10/21 0700 In: 1612 [P.O.:237; I.V.:1175; IV Piggyback:200] Out: 2992 [Urine:1550] Intake/Output this shift:    Physical Exam:  Abd: Soft/No CVAT  Lab Results:  Recent Labs  08/02/14 0430 08/03/14 0512 08/04/14 0408  HGB 8.9* 8.7* 8.8*  HCT 26.2* 26.7* 26.7*   BMET  Recent Labs  08/03/14 0512 08/04/14 0408  NA 144 138  K 3.3* 3.1*  CL 99 96  CO2 33* 33*  GLUCOSE 169* 155*  BUN 59* 53*  CREATININE 2.61* 2.29*  CALCIUM 7.8* 7.7*     Studies/Results: Ct Abdomen Pelvis Wo Contrast  08/03/2014   CLINICAL DATA:  LEFT ureteral calculus with hydronephrosis, personal history of prostate cancer with seed implantation, UTI, hypertensive heart and kidney disease, type 2 diabetes mellitus, hypertension  EXAM: CT ABDOMEN AND PELVIS WITHOUT CONTRAST  TECHNIQUE: Multidetector CT imaging of the abdomen and pelvis was performed following the standard protocol without IV contrast. Sagittal and coronal MPR images reconstructed from axial data set. Neither ulnar injury neck is contrast administered for this indication.  COMPARISON:  09/28/2010  FINDINGS: Bibasilar pleural effusions and atelectasis.  Significant BILATERAL gynecomastia  Atherosclerotic calcifications aorta and coronary arteries.  Nonobstructing large LEFT renal calculus up to 2 cm diameter.  LEFT hydronephrosis and hydroureter secondary to a 6 mm calculus at the LEFT ureterovesical junction.  Bladder decompressed by  Foley catheter.  No RIGHT hydronephrosis or urinary tract calcification.  Within limits of a nonenhanced exam, liver, spleen, pancreas, RIGHT kidney, and adrenal glands unremarkable.  Large hiatal hernia.  Small ventral hernia in anterior inferior pelvis RIGHT of midline with herniation of fat into the subcutaneous soft tissue.  Normal appendix.  Stomach and bowel loops otherwise unremarkable for technique.  No additional mass, adenopathy, or free fluid.  Brachytherapy seed implants at prostate bed.  Mild stranding of the presacral space increased since previous exam.  Degenerative changes of both hips and scattered lumbar disc space levels.  IMPRESSION: LEFT hydronephrosis and hydroureter secondary to a 6 mm LEFT UVJ calculus.  Large LEFT renal calculus up to 2 cm diameter.  BILATERAL pleural effusions and atelectasis.  Significant BILATERAL gynecomastia; correlation with physical exam recommended to exclude mass.  Large hiatal hernia.  Small anterior inferior RIGHT pelvic ventral hernia containing fat.   Electronically Signed   By: Lavonia Dana M.D.   On: 08/03/2014 14:00    Assessment/Plan: 1) AKI/left ureteral obstruction due to left ureteral calculus: Plan to proceed with definitive ureteroscopic stone removal today to unobstruct kidney both to help infection resolve and to optimize renal function although Cr continues to improve and is close to baseline now.  2) UTI: Continue ciprofloxacin.   3) Prostate cancer: Continue bicalutamide.   LOS: 5 days   Debar Plate,LES 08/04/2014, 7:02 AM

## 2014-08-04 NOTE — Transfer of Care (Signed)
Immediate Anesthesia Transfer of Care Note  Patient: Luke Johnson  Procedure(s) Performed: Procedure(s) (LRB): CYSTOSCOPY/RETROGRADE/URETEROSCOPY/STONE EXTRACTION WITH BASKET (Left)  Patient Location: PACU  Anesthesia Type: General  Level of Consciousness: sedated, patient cooperative and responds to stimulation  Airway & Oxygen Therapy: Patient Spontanous Breathing and Patient connected to face mask oxgen  Post-op Assessment: Report given to PACU RN and Post -op Vital signs reviewed and stable  Post vital signs: Reviewed and stable  Complications: No apparent anesthesia complications

## 2014-08-04 NOTE — Progress Notes (Signed)
PT Cancellation Note  Patient Details Name: NESTER BACHUS MRN: [id number] DOB: 10/12/1926   Cancelled Treatment:    Reason Eval/Treat Not Completed: Medical issues which prohibited therapy. Pt at procedure today , ureteroscopic stone removal.    Dedrick Heffner 08/04/2014, 4:41 PM Clide Dales, PT Pager: [phone] 08/04/2014

## 2014-08-04 NOTE — Anesthesia Preprocedure Evaluation (Addendum)
Anesthesia Evaluation  Patient identified by MRN, date of birth, ID band Patient confused    Reviewed: Allergy & Precautions, H&P , NPO status , Patient's Chart, lab work & pertinent test results  Airway Mallampati: II TM Distance: >3 FB Neck ROM: Full    Dental no notable dental hx.    Pulmonary neg pulmonary ROS, former smoker,  breath sounds clear to auscultation  Pulmonary exam normal       Cardiovascular hypertension, Pt. on medications Rhythm:Regular Rate:Normal     Neuro/Psych negative neurological ROS  negative psych ROS   GI/Hepatic negative GI ROS, Neg liver ROS,   Endo/Other  diabetes, Type 2  Renal/GU Renal diseasenegative Renal ROS  negative genitourinary   Musculoskeletal negative musculoskeletal ROS (+)   Abdominal   Peds negative pediatric ROS (+)  Hematology negative hematology ROS (+) anemia , thrombocytopenia   Anesthesia Other Findings   Reproductive/Obstetrics negative OB ROS                          Anesthesia Physical Anesthesia Plan  ASA: II  Anesthesia Plan: General   Post-op Pain Management:    Induction: Intravenous  Airway Management Planned: Oral ETT and LMA  Additional Equipment:   Intra-op Plan:   Post-operative Plan: Extubation in OR  Informed Consent: I have reviewed the patients History and Physical, chart, labs and discussed the procedure including the risks, benefits and alternatives for the proposed anesthesia with the patient or authorized representative who has indicated his/her understanding and acceptance.   Dental advisory given  Plan Discussed with: CRNA  Anesthesia Plan Comments:         Anesthesia Quick Evaluation

## 2014-08-05 ENCOUNTER — Encounter (HOSPITAL_COMMUNITY): Payer: Self-pay | Admitting: Urology

## 2014-08-05 LAB — RENAL FUNCTION PANEL
ANION GAP: 9 (ref 5–15)
Albumin: 1.8 g/dL — ABNORMAL LOW (ref 3.5–5.2)
BUN: 46 mg/dL — ABNORMAL HIGH (ref 6–23)
CO2: 32 mEq/L (ref 19–32)
Calcium: 7.6 mg/dL — ABNORMAL LOW (ref 8.4–10.5)
Chloride: 96 mEq/L (ref 96–112)
Creatinine, Ser: 1.98 mg/dL — ABNORMAL HIGH (ref 0.50–1.35)
GFR calc Af Amer: 34 mL/min — ABNORMAL LOW (ref >90)
GFR, EST NON AFRICAN AMERICAN: 29 mL/min — AB (ref >90)
GLUCOSE: 203 mg/dL — AB (ref 70–99)
POTASSIUM: 3.5 meq/L — AB (ref 3.7–5.3)
Phosphorus: 3 mg/dL (ref 2.3–4.6)
Sodium: 137 mEq/L (ref 137–147)

## 2014-08-05 LAB — GLUCOSE, CAPILLARY
Glucose-Capillary: 147 mg/dL — ABNORMAL HIGH (ref 70–99)
Glucose-Capillary: 156 mg/dL — ABNORMAL HIGH (ref 70–99)
Glucose-Capillary: 137 mg/dL — ABNORMAL HIGH (ref 70–99)

## 2014-08-05 LAB — CBC
HCT: 27.1 % — ABNORMAL LOW (ref 39.0–52.0)
Hemoglobin: 8.8 g/dL — ABNORMAL LOW (ref 13.0–17.0)
MCH: 25.7 pg — AB (ref 26.0–34.0)
MCHC: 32.5 g/dL (ref 30.0–36.0)
MCV: 79 fL (ref 78.0–100.0)
Platelets: 79 10*3/uL — ABNORMAL LOW (ref 150–400)
RBC: 3.43 MIL/uL — AB (ref 4.22–5.81)
RDW: 14.2 % (ref 11.5–15.5)
WBC: 9.1 10*3/uL (ref 4.0–10.5)

## 2014-08-05 MED ORDER — BISACODYL 5 MG PO TBEC: 5.0000 mg | DELAYED_RELEASE_TABLET | Freq: Every day | ORAL | Status: DC | PRN

## 2014-08-05 MED ORDER — ENSURE COMPLETE PO LIQD: 237.0000 mL | Freq: Three times a day (TID) | ORAL | Status: DC

## 2014-08-05 MED ORDER — CIPROFLOXACIN HCL 500 MG PO TABS: 500.0000 mg | ORAL_TABLET | Freq: Two times a day (BID) | ORAL | Status: DC

## 2014-08-05 MED ORDER — ENSURE COMPLETE PO LIQD
237.0000 mL | Freq: Three times a day (TID) | ORAL | Status: DC
  Administered 2014-08-05 – 2014-08-06 (×2): 237 mL via ORAL

## 2014-08-05 NOTE — Progress Notes (Signed)
ANTIBIOTIC CONSULT NOTE - FOLLOW UP  Pharmacy Consult for Cipro Indication: UTI  No Known Allergies  Patient Measurements: Height: 5\' 6"  (167.6 cm) Weight: 166 lb 7.2 oz (75.5 kg) IBW/kg (Calculated) : 63.8  Vital Signs: Temp: 98.4 F (36.9 C) (10/22 0535) Temp Source: Oral (10/22 0535) BP: 130/59 mmHg (10/22 0535) Pulse Rate: 98 (10/22 0535) Intake/Output from previous day: 10/21 0701 - 10/22 0700 In: 590 [P.O.:240; I.V.:350] Out: 450 [Urine:450]  Labs:  Recent Labs  08/03/14 0512 08/04/14 0408 08/05/14 0400  WBC 6.5 8.2 9.1  HGB 8.7* 8.8* 8.8*  PLT 52* 63* 79*  CREATININE 2.61* 2.29* 1.98*   Estimated Creatinine Clearance: 24.6 ml/min (by C-G formula based on Cr of 1.98).  Microbiology: Recent Results (from the past 720 hour(s))  URINE CULTURE     Status: None   Collection Time    07/30/14  1:52 PM      Result Value Ref Range Status   Specimen Description URINE, CLEAN CATCH   Final   Special Requests NONE   Final   Culture  Setup Time     Final   Value: 07/30/2014 21:45     Performed at [facility]     Final   Value: >=100,000 COLONIES/ML     Performed at [facility]   Culture     Final   Value: PROTEUS MIRABILIS     Performed at [facility]   Report Status 08/01/2014 FINAL   Final   Organism ID, Bacteria PROTEUS MIRABILIS   Final  MRSA PCR SCREENING     Status: None   Collection Time    07/30/14 10:11 PM      Result Value Ref Range Status   MRSA by PCR NEGATIVE  NEGATIVE Final   Comment:            The GeneXpert MRSA Assay (FDA     approved for NASAL specimens     only), is one component of a     comprehensive MRSA colonization     surveillance program. It is not     intended to diagnose MRSA     infection nor to guide or     monitor treatment for     MRSA infections.  CLOSTRIDIUM DIFFICILE BY PCR     Status: None   Collection Time    08/01/14 11:40 AM      Result Value Ref Range Status   C  difficile by pcr NEGATIVE  NEGATIVE Final   Comment: Performed at [facility]  SURGICAL PCR SCREEN     Status: Abnormal   Collection Time    08/04/14  2:25 PM      Result Value Ref Range Status   MRSA, PCR NEGATIVE  NEGATIVE Final   Staphylococcus aureus POSITIVE (*) NEGATIVE Final   Comment:            The Xpert SA Assay (FDA     approved for NASAL specimens     in patients over 52 years of age),     is one component of     a comprehensive surveillance     program.  Test performance has     been validated by [employer] for patients greater     than or equal to 71 year old.     It is not intended     to diagnose infection nor to     guide  or monitor treatment.    Assessment: 80 yoM with acute on chronic renal failure, acute encephalopathy, HTN. Pharmacy initially consulted to dose Rocephin for UTI.  Due to risk of thrombocytopenia, on 10/19, Pharmacy was consulted to change from ceftriaxone to cipro.  10/16 >> ceftriaxone >> 10/19 >> cipro >>  10/22: D4 cipro, D7 total abx  Tmax: AF  WBCs: improved to WNL  Renal: AKI on admission, now improved to SCr 1.98 with CrCl ~ 25 ml/min.   10/16 urine culture: > 100k proteus mirabilis   Goal of Therapy:  Appropriate abx dosing, eradication of infection.   Plan:   Continue Cipro 400mg  IV q24 hrs.  If CrCl improves to > 30 ml/min, increase to BID dosing.  Consider change to PO cipro when pt is tolerating diet and PO meds.  Gretta Arab PharmD, BCPS Pager [phone] 08/05/2014 11:16 AM

## 2014-08-05 NOTE — Progress Notes (Signed)
CSW following for return to [facility] when medically ready. CSW confirmed with Ms. Smith @ ALF that they would need to review patient's clinicals before accepting patient back. CSW to fax discharge summary & FL2 with medications to [phone].    CSW has completed FL2 & will continue to follow and assist with return.   Raynaldo Opitz, [facility] Clinical Social Worker cell #: [phone]

## 2014-08-05 NOTE — Progress Notes (Signed)
Patient ID: Luke Johnson, male   DOB: 29-Jan-1926, 80 y.o.   MRN: [id number]  1 Day Post-Op Subjective: Pt without pain. No new complaints. S/P left ureteroscopic removal of obstructing distal left ureteral stone yesterday. Voiding ok subjectively.  Objective: Vital signs in last 24 hours: Temp:  [97.8 F (36.6 C)-99.1 F (37.3 C)] 98.4 F (36.9 C) (10/22 0535) Pulse Rate:  [59-98] 98 (10/22 0535) Resp:  [12-35] 20 (10/22 0535) BP: (119-130)/(43-59) 130/59 mmHg (10/22 0535) SpO2:  [96 %-100 %] 96 % (10/22 0535)  Intake/Output from previous day: 10/21 0701 - 10/22 0700 In: 590 [P.O.:240; I.V.:350] Out: 450 [Urine:450] Intake/Output this shift:    Physical Exam:  General: Alert and oriented Abd: No CVAT, no abdominal tenderness  Lab Results:  Recent Labs  08/03/14 0512 08/04/14 0408 08/05/14 0400  HGB 8.7* 8.8* 8.8*  HCT 26.7* 26.7* 27.1*   BMET  Recent Labs  08/04/14 0408 08/05/14 0400  NA 138 137  K 3.1* 3.5*  CL 96 96  CO2 33* 32  GLUCOSE 155* 203*  BUN 53* 46*  CREATININE 2.29* 1.98*  CALCIUM 7.7* 7.6*     Assessment/Plan: POD#1 s/p left ureteroscopic stone removal 1) L ureteral/renal calculi: Obstructing stone removed. Will not plan to address left renal calculi considering patient's significant comorbidities and advanced age unless there becomes an absolute indication in the future. 2) AKI: Renal function now back to baseline. Catheter left out.  Will have nursing staff check PVRs considering questionable concern of poor bladder emptying upon admission. 3) Prostate cancer: Continue current therapy and plan to follow up at already scheduled appointment next spring.   LOS: 6 days   Luke Johnson,LES 08/05/2014, 7:22 AM

## 2014-08-05 NOTE — Discharge Summary (Addendum)
Physician Discharge Summary  Luke Johnson CXK:[id number] DOB: [birth year] DOA: 07/30/2014  PCP: [name], MD  Admit date: 07/30/2014 Discharge date: pending  Recommendations for Outpatient Follow-up:  1. Back to ALF for ongoing [facility] PT/OT 2. Follow up with urology in 2 weeks.  Please call to schedule appointment with [name] and assist with transportation.   3. F/u with hematology in 1-2 weeks, Dr. Lindi Adie regarding thrombocytopenia and anemia.  Please call to schedule appointment and assist with transportation.  F/u SPEP/UPEP 4. Repeat BMP and CBC in 1 week 5. Continue ciprofloxacin through 10/27, then stop  Discharge Diagnoses:  Active Problems:   Acute on chronic renal failure   Acute encephalopathy   UTI (lower urinary tract infection)   Prostate cancer   HTN (hypertension)   Dyslipidemia   Hyperglycemia   Anemia   Discharge Condition: stable, improved  Diet recommendation: regular diet  Wt Readings from Last 3 Encounters:  08/02/14 75.5 kg (166 lb 7.2 oz)  08/02/14 75.5 kg (166 lb 7.2 oz)    History of present illness:   The patient is an 80 year old male with history of hypertension, blindness, prostate cancer, hyperlipidemia, anemia who presented from assisted living facility after he was found on the floor. The patient was unable to provide a reliable history. Per the assisted-living facility, he has not been acting like himself for the 2 days prior to admission. Head CT and CT of the cervical spine were unremarkable but he was found to have a urinary tract infection with a white blood cell count of 17,000. He was started on ceftriaxone. He also had acute on chronic renal failure with a creatinine of 4.4 without evidence of urinary obstruction a bladder scan.  Hospital Course:   Acute on chronic renal failure due to dehydration and obstructing kidney stone. His baseline creatinine was 1.9. His creatinine was 4.4 on admission and he was started on IV fluids and  antibiotics for his urinary tract infection. He was seen by urology after CT scan demonstrated left hydronephrosis and hydroureter secondary to a 6 mm stone at the left UVJ. He also had a large left renal calculus that was 2 cm in diameter. He underwent cystoscopy with left ureteroscopic stone removal with left retrograde pyelography by Doctor [name] on 10/21.  His creatinine trended down to normal by the time of discharge.  Sepsis due to proteus urinary tract infection. He was initially started on ceftriaxone but he was transitioned to ciprofloxacin based on the results of his urine culture. He should complete a seven-day course of antibiotics.  Metabolic encephalopathy from urinary tract infection and acute kidney injury. CT head was negative for acute findings. He did have some anemia and thrombocytopenia however his smear was negative persistent sites suggesting a DIC, TTP, and HUS were not explanations for his confusion. He has had gradual improvement in his mentation.  Microcytic anemia, likely secondary to her renal parenchymal disease and hemoglobin remained stable at around 8.8 mg/dL.  He will followup with hematology for further evaluation of his anemia.  Thrombocytopenia, platelets nadired around 50,000.  He was seen by hematology who felt that his thrombocytopenia was secondary to medication side effect and acute illness from his acute renal failure. He did not have schistocytes so he likely did not have DIC, HUS, or TTP. His platelet count trended up gradually as he recovered from his acute illness. He should followup with hematology in approximately 2 weeks to repeat his CBC and ensure that he has  had further improvement in his anemia and thrombocytopenia.  Hypertension, blood pressure medications were held secondary to low blood pressures.  Hyperglycemia, hemoglobin A1c was 5.6. He was initially started on sliding scale insulin but his glucose levels trended down to more normal range.  His insulin was discontinued. Recommended he have a repeat hemoglobin A1c done in approximately 3-6 months. Given his age and comorbidities, his goal hemoglobin A1c would be approximately 7-1/2 and he is well below this target.  Hyperlipidemia, stable, continue statin.  Prostate cancer, stable, continue Casodex.  Diarrhea, likely secondary to urinary tract infection. His C. difficile PCR was negative. His diarrhea improved.    Consultants:  Nephrology  Heme Onc  Urology Procedures:  none Antibiotics:  Rocephin 10/17>>>10/19  Ciprofloxacin 10/19>>>   Discharge Exam: Filed Vitals:   08/05/14 0535  BP: 130/59  Pulse: 98  Temp: 98.4 F (36.9 C)  Resp: 20   Filed Vitals:   08/04/14 1645 08/04/14 1703 08/04/14 2217 08/05/14 0535  BP:  128/45 125/44 130/59  Pulse:  60 61 98  Temp: 98 F (36.7 C) 97.8 F (36.6 C) 98 F (36.7 C) 98.4 F (36.9 C)  TempSrc:   Oral Oral  Resp:  12 20 20   Height:      Weight:      SpO2:  100% 100% 96%    General:  Cachectic AAM, No acute distress, difficult to understand  HEENT: NCAT, MMM  Cardiovascular: RRR, nl S1, S2 no mrg, 2+ pulses, warm extremities  Respiratory: CTAB, no increased WOB  Abdomen: NABS, soft, NT/ND  MSK: Normal tone and bulk, no LEE  Neuro: Diffusely weak, mouth drawn on right side   Discharge Instructions     Medication List    STOP taking these medications       diltiazem 120 MG 24 hr capsule  Commonly known as:  CARDIZEM CD     furosemide 20 MG tablet  Commonly known as:  LASIX     hydrALAZINE 50 MG tablet  Commonly known as:  APRESOLINE      TAKE these medications       aspirin EC 81 MG tablet  Take 81 mg by mouth every other day.     bicalutamide 50 MG tablet  Commonly known as:  CASODEX  Take 50 mg by mouth daily.     bisacodyl 5 MG EC tablet  Commonly known as:  DULCOLAX  Take 1 tablet (5 mg total) by mouth daily as needed for moderate constipation.     ciprofloxacin 500 MG tablet   Commonly known as:  CIPRO  Take 1 tablet (500 mg total) by mouth 2 (two) times daily.     cyanocobalamin 500 MCG tablet  Take 500 mcg by mouth daily.     eucerin cream  Apply 1 application topically 2 (two) times daily. Apply to affected areas as directed.     feeding supplement (ENSURE COMPLETE) Liqd  Take 237 mLs by mouth 3 (three) times daily between meals.     FERRO-SEQUELS 50 MG CR tablet  Generic drug:  ferrous fumarate  Take 200 mg by mouth daily after breakfast.     multivitamin with minerals Tabs tablet  Take 1 tablet by mouth daily.     sennosides-docusate sodium 8.6-50 MG tablet  Commonly known as:  SENOKOT-S  Take 1 tablet by mouth daily.     simvastatin 20 MG tablet  Commonly known as:  ZOCOR  Take 20 mg by mouth at bedtime.  Follow-up Information   Follow up with [name], MD. Schedule an appointment as soon as possible for a visit in 1 week.   Specialty:  Family Medicine   Contact information:   [address] [place] [phone]       Follow up with [name], MD. Schedule an appointment as soon as possible for a visit in 2 weeks.   Specialty:  Urology   Contact information:   [place] [place] [address] [phone]       Follow up with Rulon Eisenmenger, MD. Schedule an appointment as soon as possible for a visit in 2 weeks.   Specialty:  Hematology and Oncology   Contact information:   [place] [id number] [phone]        The results of significant diagnostics from this hospitalization (including imaging, microbiology, ancillary and laboratory) are listed below for reference.    Significant Diagnostic Studies: Ct Abdomen Pelvis Wo Contrast  08/03/2014   CLINICAL DATA:  LEFT ureteral calculus with hydronephrosis, personal history of prostate cancer with seed implantation, UTI, hypertensive heart and kidney disease, type 2 diabetes mellitus, hypertension  EXAM: CT  ABDOMEN AND PELVIS WITHOUT CONTRAST  TECHNIQUE: Multidetector CT imaging of the abdomen and pelvis was performed following the standard protocol without IV contrast. Sagittal and coronal MPR images reconstructed from axial data set. Neither ulnar injury neck is contrast administered for this indication.  COMPARISON:  09/28/2010  FINDINGS: Bibasilar pleural effusions and atelectasis.  Significant BILATERAL gynecomastia  Atherosclerotic calcifications aorta and coronary arteries.  Nonobstructing large LEFT renal calculus up to 2 cm diameter.  LEFT hydronephrosis and hydroureter secondary to a 6 mm calculus at the LEFT ureterovesical junction.  Bladder decompressed by Foley catheter.  No RIGHT hydronephrosis or urinary tract calcification.  Within limits of a nonenhanced exam, liver, spleen, pancreas, RIGHT kidney, and adrenal glands unremarkable.  Large hiatal hernia.  Small ventral hernia in anterior inferior pelvis RIGHT of midline with herniation of fat into the subcutaneous soft tissue.  Normal appendix.  Stomach and bowel loops otherwise unremarkable for technique.  No additional mass, adenopathy, or free fluid.  Brachytherapy seed implants at prostate bed.  Mild stranding of the presacral space increased since previous exam.  Degenerative changes of both hips and scattered lumbar disc space levels.  IMPRESSION: LEFT hydronephrosis and hydroureter secondary to a 6 mm LEFT UVJ calculus.  Large LEFT renal calculus up to 2 cm diameter.  BILATERAL pleural effusions and atelectasis.  Significant BILATERAL gynecomastia; correlation with physical exam recommended to exclude mass.  Large hiatal hernia.  Small anterior inferior RIGHT pelvic ventral hernia containing fat.   Electronically Signed   By: Lavonia Dana M.D.   On: 08/03/2014 14:00   Dg Chest 2 View  07/30/2014   CLINICAL DATA:  Found down on the floor. Hyperglycemia with history of diabetes.  EXAM: CHEST  2 VIEW  COMPARISON:  12/2009  FINDINGS: Cardiac  silhouette is upper limits of normal to mildly enlarged in size. Large hiatal hernia is again seen. The lungs are clear. No pleural effusion or pneumothorax is identified. No acute osseous abnormality is seen.  IMPRESSION: No active cardiopulmonary disease.  Hiatal hernia.   Electronically Signed   By: Logan Bores   On: 07/30/2014 14:44   Ct Head Wo Contrast  07/30/2014   CLINICAL DATA:  Initial evaluation for confusion, patient states that he fell but cannot remember within where or hila  EXAM: CT HEAD WITHOUT CONTRAST  CT CERVICAL SPINE WITHOUT CONTRAST  TECHNIQUE: Multidetector CT imaging of the head and cervical spine was performed following the standard protocol without intravenous contrast. Multiplanar CT image reconstructions of the cervical spine were also generated.  COMPARISON:  06/28/2012  FINDINGS: CT HEAD FINDINGS  Moderate to severe atrophy. Mild low attenuation in the deep white matter. No hemorrhage or extra-axial fluid. No evidence of mass or vascular territory infarct. No hydrocephalus. Calvarium is intact.  CT CERVICAL SPINE FINDINGS  Multifocal intravenous air in the right neck likely due to IV access and injection. No other soft tissue abnormalities.  No fracture identified. No paraspinous hematoma. Normal anterior-posterior alignment. Multilevel degenerative disc disease. Degenerative change also involves posterior elements. Fusion of the posterior elements at C4 and C5 may be congenital.  IMPRESSION: No acute intracranial abnormality. No evidence of acute traumatic injury involving the cervical spine.   Electronically Signed   By: Skipper Cliche M.D.   On: 07/30/2014 14:42   Ct Cervical Spine Wo Contrast  07/30/2014   CLINICAL DATA:  Initial evaluation for confusion, patient states that he fell but cannot remember within where or hila  EXAM: CT HEAD WITHOUT CONTRAST  CT CERVICAL SPINE WITHOUT CONTRAST  TECHNIQUE: Multidetector CT imaging of the head and cervical spine was performed  following the standard protocol without intravenous contrast. Multiplanar CT image reconstructions of the cervical spine were also generated.  COMPARISON:  06/28/2012  FINDINGS: CT HEAD FINDINGS  Moderate to severe atrophy. Mild low attenuation in the deep white matter. No hemorrhage or extra-axial fluid. No evidence of mass or vascular territory infarct. No hydrocephalus. Calvarium is intact.  CT CERVICAL SPINE FINDINGS  Multifocal intravenous air in the right neck likely due to IV access and injection. No other soft tissue abnormalities.  No fracture identified. No paraspinous hematoma. Normal anterior-posterior alignment. Multilevel degenerative disc disease. Degenerative change also involves posterior elements. Fusion of the posterior elements at C4 and C5 may be congenital.  IMPRESSION: No acute intracranial abnormality. No evidence of acute traumatic injury involving the cervical spine.   Electronically Signed   By: Skipper Cliche M.D.   On: 07/30/2014 14:42   US Renal  07/31/2014   CLINICAL DATA:  Renal failure.  EXAM: RENAL/URINARY TRACT ULTRASOUND COMPLETE  COMPARISON:  CT abdomen and pelvis 09/28/2010.  FINDINGS: Right Kidney:  Length: 8.5 cm. Cortical echogenicity appears increased. No hydronephrosis is identified. 0.8 cm cyst is incidentally noted.  Left Kidney:  Length: 11.6 cm. 1.2 cm echogenic focus with posterior shadowing compatible with a stone is seen the lower pole. Multiple additional calcifications are seen in the left kidney measuring up to 2.0 cm. There is mild to moderate left hydronephrosis is identified.  Bladder:  Decompressed with a Foley catheter in place.  Small bilateral pleural effusions are identified.  IMPRESSION: Mild to moderate left hydronephrosis with multiple nonobstructing stone seen in the left kidney. Cause for hydronephrosis is not identified.  Cortical thinning of the right kidney with increased echogenicity consistent with medical renal disease.  Small bilateral  pleural effusions.   Electronically Signed   By: Inge Rise M.D.   On: 07/31/2014 13:02   Dg Chest Port 1 View  08/01/2014   CLINICAL DATA:  Shortness of breath.  EXAM: PORTABLE CHEST - 1 VIEW  COMPARISON:  PA and lateral chest 07/30/2014 and 01/09/2010.  FINDINGS: Large hiatal hernia is again seen. There is some left basilar atelectasis. No consolidative process, pneumothorax or effusion. Cardiomegaly  noted.  IMPRESSION: Cardiomegaly without acute disease.  Hiatal hernia.   Electronically Signed   By: Inge Rise M.D.   On: 08/01/2014 15:21    Microbiology: Recent Results (from the past 240 hour(s))  URINE CULTURE     Status: None   Collection Time    07/30/14  1:52 PM      Result Value Ref Range Status   Specimen Description URINE, CLEAN CATCH   Final   Special Requests NONE   Final   Culture  Setup Time     Final   Value: 07/30/2014 21:45     Performed at [facility]     Final   Value: >=100,000 COLONIES/ML     Performed at [facility]   Culture     Final   Value: PROTEUS MIRABILIS     Performed at [facility]   Report Status 08/01/2014 FINAL   Final   Organism ID, Bacteria PROTEUS MIRABILIS   Final  MRSA PCR SCREENING     Status: None   Collection Time    07/30/14 10:11 PM      Result Value Ref Range Status   MRSA by PCR NEGATIVE  NEGATIVE Final   Comment:            The GeneXpert MRSA Assay (FDA     approved for NASAL specimens     only), is one component of a     comprehensive MRSA colonization     surveillance program. It is not     intended to diagnose MRSA     infection nor to guide or     monitor treatment for     MRSA infections.  CLOSTRIDIUM DIFFICILE BY PCR     Status: None   Collection Time    08/01/14 11:40 AM      Result Value Ref Range Status   C difficile by pcr NEGATIVE  NEGATIVE Final   Comment: Performed at [facility]  SURGICAL PCR SCREEN     Status: Abnormal   Collection Time     08/04/14  2:25 PM      Result Value Ref Range Status   MRSA, PCR NEGATIVE  NEGATIVE Final   Staphylococcus aureus POSITIVE (*) NEGATIVE Final   Comment:            The Xpert SA Assay (FDA     approved for NASAL specimens     in patients over 66 years of age),     is one component of     a comprehensive surveillance     program.  Test performance has     been validated by [employer] for patients greater     than or equal to 32 year old.     It is not intended     to diagnose infection nor to     guide or monitor treatment.     Labs: Basic Metabolic Panel:  Recent Labs Lab 08/01/14 0536 08/02/14 0430 08/03/14 0512 08/04/14 0408 08/05/14 0400  NA 146 139 144 138 137  K 3.9 3.4* 3.3* 3.1* 3.5*  CL 108 97 99 96 96  CO2 22 31 33* 33* 32  GLUCOSE 92 136* 169* 155* 203*  BUN 68* 67* 59* 53* 46*  CREATININE 3.94* 2.84* 2.61* 2.29* 1.98*  CALCIUM 7.8* 7.6* 7.8* 7.7* 7.6*  PHOS 4.0 3.1 2.9 2.6 3.0   Liver Function Tests:  Recent Labs  Lab 07/30/14 1337 08/01/14 0536 08/02/14 0430 08/03/14 0512 08/04/14 0408 08/05/14 0400  AST 35  --  36  --   --   --   ALT 20  --  29  --   --   --   ALKPHOS 81  --  103  --   --   --   BILITOT 0.4  --  0.2*  --   --   --   PROT 6.9  --  5.5*  --   --   --   ALBUMIN 2.9* 2.0* 1.9* 1.9* 1.8* 1.8*   No results found for this basename: LIPASE, AMYLASE,  in the last 168 hours No results found for this basename: AMMONIA,  in the last 168 hours CBC:  Recent Labs Lab 08/01/14 0536 08/02/14 0430 08/03/14 0512 08/04/14 0408 08/05/14 0400  WBC 16.9* 7.5 6.5 8.2 9.1  HGB 8.8* 8.9* 8.7* 8.8* 8.8*  HCT 26.2* 26.2* 26.7* 26.7* 27.1*  MCV 78.4 77.3* 79.7 78.3 79.0  PLT 64* 55* 52* 63* 79*   Cardiac Enzymes:  Recent Labs Lab 07/30/14 1353 07/30/14 1522  07/30/14 2250 07/31/14 0250 07/31/14 0649 07/31/14 1044 07/31/14 1333  CKTOTAL  --  754*  < > 1215* 1102* 990* 1160* 1102*  CKMB  --  4.7*  --   --   --   --   --   --    TROPONINI <0.30  --   --   --   --   --   --   --   < > = values in this interval not displayed. BNP: BNP (last 3 results) No results found for this basename: PROBNP,  in the last 8760 hours CBG:  Recent Labs Lab 08/04/14 1721 08/04/14 2213 08/05/14 0747 08/05/14 1226 08/05/14 1718  GLUCAP 194* 198* 147* 156* 137*    Time coordinating discharge: 45 minutes  Signed:  Aaliyana Fredericks  [facility] 08/05/2014, 6:05 PM

## 2014-08-06 DIAGNOSIS — E43 Unspecified severe protein-calorie malnutrition: Secondary | ICD-10-CM

## 2014-08-06 LAB — CBC
HCT: 28 % — ABNORMAL LOW (ref 39.0–52.0)
HEMOGLOBIN: 9.1 g/dL — AB (ref 13.0–17.0)
MCH: 26.1 pg (ref 26.0–34.0)
MCHC: 32.5 g/dL (ref 30.0–36.0)
MCV: 80.2 fL (ref 78.0–100.0)
Platelets: 123 10*3/uL — ABNORMAL LOW (ref 150–400)
RBC: 3.49 MIL/uL — AB (ref 4.22–5.81)
RDW: 14.3 % (ref 11.5–15.5)
WBC: 8.3 10*3/uL (ref 4.0–10.5)

## 2014-08-06 LAB — RENAL FUNCTION PANEL
Albumin: 1.9 g/dL — ABNORMAL LOW (ref 3.5–5.2)
Anion gap: 10 (ref 5–15)
BUN: 40 mg/dL — ABNORMAL HIGH (ref 6–23)
CALCIUM: 7.6 mg/dL — AB (ref 8.4–10.5)
CO2: 30 meq/L (ref 19–32)
CREATININE: 1.87 mg/dL — AB (ref 0.50–1.35)
Chloride: 98 mEq/L (ref 96–112)
GFR calc Af Amer: 36 mL/min — ABNORMAL LOW (ref >90)
GFR, EST NON AFRICAN AMERICAN: 31 mL/min — AB (ref >90)
GLUCOSE: 188 mg/dL — AB (ref 70–99)
Phosphorus: 2.7 mg/dL (ref 2.3–4.6)
Potassium: 3.4 mEq/L — ABNORMAL LOW (ref 3.7–5.3)
Sodium: 138 mEq/L (ref 137–147)

## 2014-08-06 MED ORDER — ENSURE PUDDING PO PUDG
1.0000 | Freq: Two times a day (BID) | ORAL | Status: DC
  Administered 2014-08-08: 1 via ORAL
  Filled 2014-08-06 (×5): qty 1

## 2014-08-06 MED ORDER — ENSURE COMPLETE PO LIQD
237.0000 mL | ORAL | Status: DC
  Administered 2014-08-07 – 2014-08-08 (×2): 237 mL via ORAL

## 2014-08-06 MED ORDER — MIRTAZAPINE 15 MG PO TABS
15.0000 mg | ORAL_TABLET | Freq: Every day | ORAL | Status: DC
  Administered 2014-08-06 – 2014-08-07 (×2): 15 mg via ORAL
  Filled 2014-08-06 (×3): qty 1

## 2014-08-06 NOTE — Progress Notes (Signed)
Physical Therapy Treatment Patient Details Name: Luke Johnson MRN: [id number] DOB: 1925-11-06 Today's Date: 08/06/2014    History of Present Illness      PT Comments    Patient required max encouragement to participate today secondary to lethargy; patient repeated "I'm tired".  Patient was max A +2 with all mobility.  He also requires constant cueing and redirection due to mental status.  Patient requires constant manual hand-over-hand placement due to vision impairments.    Follow Up Recommendations  SNF     Equipment Recommendations       Recommendations for Other Services       Precautions / Restrictions Precautions Precautions: Fall    Mobility  Bed Mobility Overal bed mobility: Needs Assistance;+2 for physical assistance Bed Mobility: Supine to Sit     Supine to sit: +2 for physical assistance;Max assist     General bed mobility comments: patient requires max motivation and redirecting d/t decreased cognition  Transfers Overall transfer level: Needs assistance Equipment used: 4-wheeled walker Transfers: Sit to/from Stand Sit to Stand: +2 physical assistance;Max assist         General transfer comment: patient requires max motivation and redirecting d/t decreased cognition; he requires manual hand placement and VC's for sequencing d/t to vision impairments   Ambulation/Gait Ambulation/Gait assistance: Mod assist;+2 physical assistance;Max assist Ambulation Distance (Feet): 5 Feet Assistive device: 4-wheeled walker Gait Pattern/deviations: Decreased step length - left;Decreased step length - right Gait velocity: decreased    General Gait Details: VC's required for sequencing; patient uncooperative d/t decreased cognition and lethargy   Stairs            Wheelchair Mobility    Modified Rankin (Stroke Patients Only)       Balance                                    Cognition                             Exercises      General Comments        Pertinent Vitals/Pain Pain Assessment: No/denies pain    Home Living                      Prior Function            PT Goals (current goals can now be found in the care plan section) Progress towards PT goals: Progressing toward goals    Frequency  Min 3X/week    PT Plan      Co-evaluation             End of Session Equipment Utilized During Treatment: Gait belt Activity Tolerance: Patient limited by lethargy Patient left: in chair;with call bell/phone within reach;with chair alarm set     Time: 1962-2297 PT Time Calculation (min): 26 min  Charges:  $Therapeutic Activity: 23-37 mins                    G Codes:      Miller,Derrick  PTA student 08/06/2014, 3:58 PM  Reviewed above  [name]  PTA [facility]  Acute  Rehab Pager      [phone]

## 2014-08-06 NOTE — Progress Notes (Signed)
INITIAL NUTRITION ASSESSMENT  DOCUMENTATION CODES Per approved criteria  -Severe malnutrition in the context of chronic illness  Pt meets criteria for severe MALNUTRITION in the context of chronic illness as evidenced by severe muscle wasting and subcutaneous fat loss, likely PO intake < 75% for > one month.   INTERVENTION: -Recommend Ensure Complete po Q24H, each supplement provides 350 kcal and 13 grams of protein -Recommend Ensure Pudding po BID, each supplement provides 170 kcal and 4 grams of protein -Provide MagicCup with meals -RD to continue to monitor   NUTRITION DIAGNOSIS: Inadequate oral intake related to AMS/decreased appetite as evidenced by PO intake < 75%, severe muscle wasting and fat loss.   Goal: Pt to meet >/= 90% of their estimated nutrition needs    Monitor:  Total protein/energy intake, labs,weights  Reason for Assessment: Consult to Assess  80 y.o. male  Admitting Dx: <principal problem not specified>  ASSESSMENT: Luke Johnson is a 80 y.o. male, with known past medical history of hypertension, blindness, malignant neoplasm of prostate, dyslipidemia, edema, anemia, represents from skilled nursing facility as he was found on the floor, patient is a poor historian, can't provide any reliable history, spoke with patient doesn't over the phone, report patient is usually active, awake alert with good mentation, ambulates without assistance, as per nursing home over the last 2 days patient has not been acting himself but afebrile, no leukocytosis, was on the floor for unknown period of time  -Pt confused and agitated this morning during time of RD assessment. Confirmed poor PO intake for prolonged period of time, and confirmed unintentional wt loss. Could not confirm when loss of appetite began or how much/when weight loss occurred. Believed it to be > 10 lbs -Pt allowed RD to perform nutrition focused physical exam on upper arm and clavicle region; however refused  further examination. Evident severe subcutaneous fat loss in temporal and occipital regions,facial region appears very jaunt. Severe muscle wasting evident in upper arm region -Current PO intake 20%. Per discussion with RN, pt consumed Ensure last night; however refused this AM. RD noted 1-2 sips of supplement consumed at bedside -Pt on 1200 ml fluid restriction. Will allow Ensure Complete once daily, as RDnoted it is easier for pt to drink than to eat. RD to modify remainder of supplements to pudding or MagicCup form -BUN/Crt elevated d/t acute on chronic kidney disease  Height: Ht Readings from Last 1 Encounters:  08/01/14 5\' 6"  (1.676 m)    Weight: Wt Readings from Last 1 Encounters:  08/02/14 166 lb 7.2 oz (75.5 kg)    Ideal Body Weight: 142 lb  % Ideal Body Weight: 117%  Wt Readings from Last 10 Encounters:  08/02/14 166 lb 7.2 oz (75.5 kg)  08/02/14 166 lb 7.2 oz (75.5 kg)    Usual Body Weight: unable to determine  % Usual Body Weight: unable to determine  BMI:  Body mass index is 26.88 kg/(m^2).  Estimated Nutritional Needs: Kcal: 1850-2050 Protein: 75-90 gram  Fluid: 1200 ml fluid restriction per MD  Skin: non pitting generalized edema  Diet Order: General  EDUCATION NEEDS: -No education needs identified at this time   Intake/Output Summary (Last 24 hours) at 08/06/14 1118 Last data filed at 08/06/14 0917  Gross per 24 hour  Intake    240 ml  Output    100 ml  Net    140 ml    Last BM: PTA   Labs:   Recent Labs Lab 08/04/14 0408 08/05/14 0400  08/06/14 0433  NA 138 137 138  K 3.1* 3.5* 3.4*  CL 96 96 98  CO2 33* 32 30  BUN 53* 46* 40*  CREATININE 2.29* 1.98* 1.87*  CALCIUM 7.7* 7.6* 7.6*  PHOS 2.6 3.0 2.7  GLUCOSE 155* 203* 188*    CBG (last 3)   Recent Labs  08/05/14 0747 08/05/14 1226 08/05/14 1718  GLUCAP 147* 156* 137*    Scheduled Meds: . antiseptic oral rinse  15 mL Mouth Rinse BID  . bicalutamide  50 mg Oral Daily  .  ciprofloxacin  400 mg Intravenous Q24H  . cyanocobalamin  500 mcg Oral Daily  . [START ON 08/07/2014] feeding supplement (ENSURE COMPLETE)  237 mL Oral Q24H  . feeding supplement (ENSURE)  1 Container Oral BID BM  . ferrous fumarate  106 mg of iron Oral QPC breakfast  . senna  1 tablet Oral BID  . simvastatin  20 mg Oral q1800    Continuous Infusions:   Past Medical History  Diagnosis Date  . 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 Chronic kidney disease, stage III (moderate)   . Legal blindness, as defined in [place]   . Carcinoma in situ of prostate   . Anemia, unspecified   . Type II or unspecified type diabetes mellitus with renal manifestations, not stated as uncontrolled   . Other and unspecified hyperlipidemia   . Edema   . Obesity, unspecified   . Diabetes mellitus   . Dyslipidemia     Past Surgical History  Procedure Laterality Date  . No past surgeries    . Cystoscopy/retrograde/ureteroscopy/stone extraction with basket Left 08/04/2014    Procedure: CYSTOSCOPY/RETROGRADE/URETEROSCOPY/STONE EXTRACTION WITH BASKET;  Surgeon: Raynelle Bring, MD;  Location: [facility];  Service: Urology;  Laterality: Left;    Atlee Abide MS RD [place] Clinical Dietitian [phone]

## 2014-08-06 NOTE — Progress Notes (Signed)
PT Cancellation Note  ___Treatment cancelled today due to medical issues with patient which prohibited therapy  ___ Treatment cancelled today due to patient receiving procedure or test   ___ Treatment cancelled today due to patient's refusal to participate   _X_ Treatment cancelled today due to altered mental status.  Pt unable to follow commands.  Noted increased agitation.  Will try to attempt later as schedule permits.  Reported to RN pt was unable to participate.  [name]  PTA [facility]  Acute  Rehab Pager      [phone]

## 2014-08-06 NOTE — Progress Notes (Signed)
[facility] PROGRESS NOTE  Luke Johnson WGY:[id number] DOB: 15-Jan-1926 DOA: 07/30/2014 PCP: [name], MD  Assessment/Plan  Severe protein calorie malnutrition -  Appreciate nutrition assistance  -  Regular diet with supplements -  Start mirtazapine -  Discussed with family who are to going to bring in some favorite foods  Acute on chronic renal failure, baseline creatinine 1.9, 4.4 on admission and back to baseline with IVF and resolution of obstructing stone.    Hydronephrosis, s/p cystoscopy with left ureteroscopy and stone removal with left retrograde pyelography by Doctor [name] on 77/93   Metabolic encephalopathy secondary to his UTI, worsening renal failure, no acute finding in CT head, no focal neurological deficit , HUS workup is negative.  Back to baseline.  Thrombocytopenia resolving spontaneously.  Proteus UTI, continue ciprofloxacin  Hypertension with low BPs -  Continue to hold BP medications  Diet:  regular Access: PIV  IVF: off Proph: SCD   Code Status: full code  Family Communication: patient and HPOA Disposition Plan:  SNF v. ALF  Consultants:  Nephrology  Heme Onc  Urology Procedures:  none Antibiotics:  Rocephin 10/17>>>10/19  Ciprofloxacin 10/19>>>  HPI/Subjective:  Eating a little better today.  No acute complaints   Objective: Filed Vitals:   08/05/14 0535 08/05/14 2112 08/06/14 0438 08/06/14 1500  BP: 130/59 130/55 132/53 117/51  Pulse: 98 62 64 63  Temp: 98.4 F (36.9 C) 98.7 F (37.1 C) 99.1 F (37.3 C) 97.9 F (36.6 C)  TempSrc: Oral Oral Axillary Axillary  Resp: 20 16 16 16   Height:      Weight:      SpO2: 96% 99% 97% 100%    Intake/Output Summary (Last 24 hours) at 08/06/14 1834 Last data filed at 08/06/14 1801  Gross per 24 hour  Intake    240 ml  Output    250 ml  Net    -10 ml   Filed Weights   08/01/14 0423 08/02/14 0513  Weight: 75.5 kg (166 lb 7.2 oz) 75.5 kg (166 lb 7.2 oz)     Exam:  General: AAM, No acute distress HEENT: NCAT, MMM  Cardiovascular: RRR, nl S1, S2 no mrg, 2+ pulses, warm extremities  Respiratory: CTAB, no increased WOB  Abdomen: NABS, soft, NT/ND  MSK: Normal tone and bulk, no LEE  Neuro: Diffusely weak   Data Reviewed: Basic Metabolic Panel:  Recent Labs Lab 08/02/14 0430 08/03/14 0512 08/04/14 0408 08/05/14 0400 08/06/14 0433  NA 139 144 138 137 138  K 3.4* 3.3* 3.1* 3.5* 3.4*  CL 97 99 96 96 98  CO2 31 33* 33* 32 30  GLUCOSE 136* 169* 155* 203* 188*  BUN 67* 59* 53* 46* 40*  CREATININE 2.84* 2.61* 2.29* 1.98* 1.87*  CALCIUM 7.6* 7.8* 7.7* 7.6* 7.6*  PHOS 3.1 2.9 2.6 3.0 2.7   Liver Function Tests:  Recent Labs Lab 08/02/14 0430 08/03/14 0512 08/04/14 0408 08/05/14 0400 08/06/14 0433  AST 36  --   --   --   --   ALT 29  --   --   --   --   ALKPHOS 103  --   --   --   --   BILITOT 0.2*  --   --   --   --   PROT 5.5*  --   --   --   --   ALBUMIN 1.9* 1.9* 1.8* 1.8* 1.9*   No results found for this basename: LIPASE, AMYLASE,  in the  last 168 hours No results found for this basename: AMMONIA,  in the last 168 hours CBC:  Recent Labs Lab 08/02/14 0430 08/03/14 0512 08/04/14 0408 08/05/14 0400 08/06/14 0433  WBC 7.5 6.5 8.2 9.1 8.3  HGB 8.9* 8.7* 8.8* 8.8* 9.1*  HCT 26.2* 26.7* 26.7* 27.1* 28.0*  MCV 77.3* 79.7 78.3 79.0 80.2  PLT 55* 52* 63* 79* 123*   Cardiac Enzymes:  Recent Labs Lab 07/30/14 2250 07/31/14 0250 07/31/14 0649 07/31/14 1044 07/31/14 1333  CKTOTAL 1215* 1102* 990* 1160* 1102*   BNP (last 3 results) No results found for this basename: PROBNP,  in the last 8760 hours CBG:  Recent Labs Lab 08/04/14 1721 08/04/14 2213 08/05/14 0747 08/05/14 1226 08/05/14 1718  GLUCAP 194* 198* 147* 156* 137*    Recent Results (from the past 240 hour(s))  URINE CULTURE     Status: None   Collection Time    07/30/14  1:52 PM      Result Value Ref Range Status   Specimen Description  URINE, CLEAN CATCH   Final   Special Requests NONE   Final   Culture  Setup Time     Final   Value: 07/30/2014 21:45     Performed at [place]     Final   Value: >=100,000 COLONIES/ML     Performed at [facility]   Culture     Final   Value: PROTEUS MIRABILIS     Performed at [facility]   Report Status 08/01/2014 FINAL   Final   Organism ID, Bacteria PROTEUS MIRABILIS   Final  MRSA PCR SCREENING     Status: None   Collection Time    07/30/14 10:11 PM      Result Value Ref Range Status   MRSA by PCR NEGATIVE  NEGATIVE Final   Comment:            The GeneXpert MRSA Assay (FDA     approved for NASAL specimens     only), is one component of a     comprehensive MRSA colonization     surveillance program. It is not     intended to diagnose MRSA     infection nor to guide or     monitor treatment for     MRSA infections.  CLOSTRIDIUM DIFFICILE BY PCR     Status: None   Collection Time    08/01/14 11:40 AM      Result Value Ref Range Status   C difficile by pcr NEGATIVE  NEGATIVE Final   Comment: Performed at [facility]  SURGICAL PCR SCREEN     Status: Abnormal   Collection Time    08/04/14  2:25 PM      Result Value Ref Range Status   MRSA, PCR NEGATIVE  NEGATIVE Final   Staphylococcus aureus POSITIVE (*) NEGATIVE Final   Comment:            The Xpert SA Assay (FDA     approved for NASAL specimens     in patients over 55 years of age),     is one component of     a comprehensive surveillance     program.  Test performance has     been validated by [employer] for patients greater     than or equal to 34 year old.     It is not intended     to  diagnose infection nor to     guide or monitor treatment.     Studies: No results found.  Scheduled Meds: . antiseptic oral rinse  15 mL Mouth Rinse BID  . bicalutamide  50 mg Oral Daily  . ciprofloxacin  400 mg Intravenous Q24H  . cyanocobalamin  500 mcg Oral Daily   . [START ON 08/07/2014] feeding supplement (ENSURE COMPLETE)  237 mL Oral Q24H  . feeding supplement (ENSURE)  1 Container Oral BID BM  . ferrous fumarate  106 mg of iron Oral QPC breakfast  . mirtazapine  15 mg Oral QHS  . senna  1 tablet Oral BID  . simvastatin  20 mg Oral q1800   Continuous Infusions:   Active Problems:   Acute on chronic renal failure   Acute encephalopathy   UTI (lower urinary tract infection)   Prostate cancer   HTN (hypertension)   Dyslipidemia   Hyperglycemia   Anemia    Time spent: 30 min    Janina Trafton, [place] [facility] Pager [phone]. If 7PM-7AM, please contact night-coverage at [email or web address], password [facility] 08/06/2014, 6:34 PM  LOS: 7 days

## 2014-08-06 NOTE — Clinical Social Work Note (Signed)
CSW continues to follow for d/c planning needs. CSW made aware patient ready for d/c to [facility]. CSW contacted facility and confirmed patient can return. CSW prepared d/c packet and spoke with RN who states patient refusing PT and is unaware if patient will be able to ambulate at ALF. CSW made MD (Short) aware. CSW attempted to meet with patient, however patient refusing to speak with CSW. CSW contacted facility and was informed patient can return on Saturday via am supervisor ([place]) or pm supervisor Kerby Nora) and to fax d/c summary and FL2 to [phone]). CSW to follow tomorrow.   [place], [place] Weekend Clinical Social Worker [phone]

## 2014-08-06 NOTE — Progress Notes (Signed)
OT Cancellation Note  Patient Details Name: Luke Johnson MRN: [id number] DOB: 06/04/1926   Cancelled Treatment:    Reason Eval/Treat Not Completed: Other (comment) Pt declines getting up right now and states, "leave me alone." Nursing aware that pt has refused PT/OT this am. Will try later time.  Jules Schick [phone] 08/06/2014, 12:49 PM

## 2014-08-07 NOTE — Progress Notes (Addendum)
CSW continuing to follow.   CSW discussed with MD regarding pt disposition planning.   Per MD, current concern is that pt is requiring max assist with mobility and not requiring assistance with feeding. MD asked CSW to contact [facility] to discuss.  CSW contacted ALF and spoke with weekend supervisor, Tish. CSW discussed with ALF pt current care concerns. ALF stated that pt was fairly independent at the facility and required assistance with showers. CSW discussed with ALF that per MD, it is difficult to determine if pt is just declining to walk or if he has declined to where he is requiring more assistance. CSW discussed with ALF that MD had asked family to bring in food that pt may like and encourage pt to move around more to determine pt mobility. Per ALF, pt often does what he sets his mind to, but supervisor, Tish wanted to discuss with Resident Care Coordinator to determine if Resident Care Coordinator from ALF would need to come do an assessment of the pt. Weekend Librarian, academic at [employer] [facility] will contact this CSW later today to update.  CSW updated MD.  CSW to continue to follow to assist with disposition planning.   Addendum 12:11 pm:  CSW received return phone call from supervisor at [place]. Per [facility], facility Resident Care Coordinator is going to come to the hospital today to assess pt appropriateness for return to ALF level of care.  CSW to await return phone call from [place] regarding determination from assessment.  CSW to continue to follow.  Alison Murray, MSW, LCSW Clinical Social Work Weekend coverage [phone]

## 2014-08-07 NOTE — Progress Notes (Signed)
CSW continuing to follow for disposition planning.  CSW received return phone call from pt HCPOA, Jaquita Folds. CSW discussed with pt HCPOA that [facility] recommending pt to have short term rehab at [facility] before returning home. CSW discussed SNF bed offers available during time of conversation. Pt HCPOA wanted to discuss with his brother to make decision.  CSW received another call from pt HCPOA, Jaquita Folds stating that choice for SNF is [facility]. CSW discussed with pt HCPOA that pt will be medically ready for discharge on Sunday 10/24 and weekend CSW will communicate with pt HCPOA re: pt discharge. Pt HCPOA expressed understanding.   CSW contacted [facility], but weekend supervisor was currently busy and could not speak with this CSW. CSW left contact information.  Weekend CSW will follow up with [employer] on Sunday to discuss admission to [facility].  CSW to continue to follow and facilitate pt discharge needs to [employer].  Alison Murray, MSW, LCSW Clinical Social Work Weekend coverage [phone]

## 2014-08-07 NOTE — Progress Notes (Signed)
Agree with previous rn assessment, will continue to monitor pt.

## 2014-08-07 NOTE — Progress Notes (Signed)
CSW continuing to follow.  CSW received phone call from [facility] following Resident Care Coordinator's assessment of pt. Per [facility], ALF feels that pt will need to go to short term rehab before returning to ALF level of care.   CSW visited pt room. Pt sleeping at this time and no family present at bedside. CSW contacted pt HCPOA, Jaquita Folds via telephone and left message.   CSW reviewed chart and noted that unit CSW had discussed with pt HCPOA regarding SNF placement if [facility] felt that they were unable to accept pt back and pt HCPOA agreeable.  CSW updated FL2 and initiated SNF search.  CSW to follow up with HCPOA with bed offers.  CSW to facilitate pt discharge needs once able to reach pt HCPOA and discuss decision re: SNF.   Alison Murray, MSW, LCSW Clinical Social Work [employer] [phone]

## 2014-08-07 NOTE — Progress Notes (Signed)
[facility] PROGRESS NOTE  Luke Johnson TIW:[id number] DOB: 07-06-1926 DOA: 07/30/2014 PCP: [name], MD  Assessment/Plan  Severe protein calorie malnutrition, ate much better today -  Appreciate nutrition assistance  -  Change to finger foods -  Continue mirtazapine  Generalized weakness -  Assessed by ALF today who feels he needs rehab before returning to their facility -  SW speaking with pateint and family regarding placement options, possible bed available tomorrow  Acute on chronic renal failure, baseline creatinine 1.9, 4.4 on admission and back to baseline with IVF and resolution of obstructing stone.    Hydronephrosis, s/p cystoscopy with left ureteroscopy and stone removal with left retrograde pyelography by Doctor [name] on 25/05   Metabolic encephalopathy secondary to his UTI, worsening renal failure, no acute finding in CT head, no focal neurological deficit , HUS workup is negative.  Back to baseline.  Thrombocytopenia resolving spontaneously.  Proteus UTI, continue ciprofloxacin  Hypertension with low BPs -  Continue to hold BP medications  Diet:  regular Access: PIV  IVF: off Proph: SCD   Code Status: full code  Family Communication: patient  Disposition Plan:  SNF ASAP  Consultants:  Nephrology  Heme Onc  Urology Procedures:  none Antibiotics:  Rocephin 10/17>>>10/19  Ciprofloxacin 10/19>>>  HPI/Subjective:  Eating much better today.  No acute complaints   Objective: Filed Vitals:   08/06/14 1500 08/06/14 2337 08/07/14 0523 08/07/14 1452  BP: 117/51 142/56 157/49 118/45  Pulse: 63 65 69 69  Temp: 97.9 F (36.6 C) 97.9 F (36.6 C) 98.9 F (37.2 C) 98.7 F (37.1 C)  TempSrc: Axillary Oral Oral Oral  Resp: 16 20 20 20   Height:      Weight:      SpO2: 100% 100% 100% 100%    Intake/Output Summary (Last 24 hours) at 08/07/14 1849 Last data filed at 08/07/14 1700  Gross per 24 hour  Intake    840 ml  Output    470  ml  Net    370 ml   Filed Weights   08/01/14 0423 08/02/14 0513  Weight: 75.5 kg (166 lb 7.2 oz) 75.5 kg (166 lb 7.2 oz)    Exam:  General: AAM, No acute distress HEENT: NCAT, MMM  Cardiovascular: RRR, nl S1, S2 no mrg, 2+ pulses, warm extremities  Respiratory: CTAB, no increased WOB  Abdomen: NABS, soft, NT/ND  MSK: Normal tone and bulk, no LEE  Neuro: Diffusely weak   Data Reviewed: Basic Metabolic Panel:  Recent Labs Lab 08/02/14 0430 08/03/14 0512 08/04/14 0408 08/05/14 0400 08/06/14 0433  NA 139 144 138 137 138  K 3.4* 3.3* 3.1* 3.5* 3.4*  CL 97 99 96 96 98  CO2 31 33* 33* 32 30  GLUCOSE 136* 169* 155* 203* 188*  BUN 67* 59* 53* 46* 40*  CREATININE 2.84* 2.61* 2.29* 1.98* 1.87*  CALCIUM 7.6* 7.8* 7.7* 7.6* 7.6*  PHOS 3.1 2.9 2.6 3.0 2.7   Liver Function Tests:  Recent Labs Lab 08/02/14 0430 08/03/14 0512 08/04/14 0408 08/05/14 0400 08/06/14 0433  AST 36  --   --   --   --   ALT 29  --   --   --   --   ALKPHOS 103  --   --   --   --   BILITOT 0.2*  --   --   --   --   PROT 5.5*  --   --   --   --  ALBUMIN 1.9* 1.9* 1.8* 1.8* 1.9*   No results found for this basename: LIPASE, AMYLASE,  in the last 168 hours No results found for this basename: AMMONIA,  in the last 168 hours CBC:  Recent Labs Lab 08/02/14 0430 08/03/14 0512 08/04/14 0408 08/05/14 0400 08/06/14 0433  WBC 7.5 6.5 8.2 9.1 8.3  HGB 8.9* 8.7* 8.8* 8.8* 9.1*  HCT 26.2* 26.7* 26.7* 27.1* 28.0*  MCV 77.3* 79.7 78.3 79.0 80.2  PLT 55* 52* 63* 79* 123*   Cardiac Enzymes: No results found for this basename: CKTOTAL, CKMB, CKMBINDEX, TROPONINI,  in the last 168 hours BNP (last 3 results) No results found for this basename: PROBNP,  in the last 8760 hours CBG:  Recent Labs Lab 08/04/14 1721 08/04/14 2213 08/05/14 0747 08/05/14 1226 08/05/14 1718  GLUCAP 194* 198* 147* 156* 137*    Recent Results (from the past 240 hour(s))  URINE CULTURE     Status: None   Collection  Time    07/30/14  1:52 PM      Result Value Ref Range Status   Specimen Description URINE, CLEAN CATCH   Final   Special Requests NONE   Final   Culture  Setup Time     Final   Value: 07/30/2014 21:45     Performed at [place]     Final   Value: >=100,000 COLONIES/ML     Performed at [place]     Final   Value: PROTEUS MIRABILIS     Performed at [facility]   Report Status 08/01/2014 FINAL   Final   Organism ID, Bacteria PROTEUS MIRABILIS   Final  MRSA PCR SCREENING     Status: None   Collection Time    07/30/14 10:11 PM      Result Value Ref Range Status   MRSA by PCR NEGATIVE  NEGATIVE Final   Comment:            The GeneXpert MRSA Assay (FDA     approved for NASAL specimens     only), is one component of a     comprehensive MRSA colonization     surveillance program. It is not     intended to diagnose MRSA     infection nor to guide or     monitor treatment for     MRSA infections.  CLOSTRIDIUM DIFFICILE BY PCR     Status: None   Collection Time    08/01/14 11:40 AM      Result Value Ref Range Status   C difficile by pcr NEGATIVE  NEGATIVE Final   Comment: Performed at [facility]  SURGICAL PCR SCREEN     Status: Abnormal   Collection Time    08/04/14  2:25 PM      Result Value Ref Range Status   MRSA, PCR NEGATIVE  NEGATIVE Final   Staphylococcus aureus POSITIVE (*) NEGATIVE Final   Comment:            The Xpert SA Assay (FDA     approved for NASAL specimens     in patients over 78 years of age),     is one component of     a comprehensive surveillance     program.  Test performance has     been validated by [employer] for patients greater     than or equal to 78 year old.  It is not intended     to diagnose infection nor to     guide or monitor treatment.     Studies: No results found.  Scheduled Meds: . antiseptic oral rinse  15 mL Mouth Rinse BID  . bicalutamide  50 mg Oral  Daily  . ciprofloxacin  400 mg Intravenous Q24H  . cyanocobalamin  500 mcg Oral Daily  . feeding supplement (ENSURE COMPLETE)  237 mL Oral Q24H  . feeding supplement (ENSURE)  1 Container Oral BID BM  . ferrous fumarate  106 mg of iron Oral QPC breakfast  . mirtazapine  15 mg Oral QHS  . senna  1 tablet Oral BID  . simvastatin  20 mg Oral q1800   Continuous Infusions:   Active Problems:   Acute on chronic renal failure   Acute encephalopathy   UTI (lower urinary tract infection)   Prostate cancer   HTN (hypertension)   Dyslipidemia   Hyperglycemia   Anemia   Protein-calorie malnutrition, severe    Time spent: 30 min    Nicola Heinemann, [place] [facility] Pager [phone]. If 7PM-7AM, please contact night-coverage at [email or web address], password [facility] 08/07/2014, 6:49 PM  LOS: 8 days

## 2014-08-08 MED ORDER — ENSURE COMPLETE PO LIQD: 237.0000 mL | ORAL | Status: DC

## 2014-08-08 MED ORDER — MIRTAZAPINE 15 MG PO TABS: 15.0000 mg | ORAL_TABLET | Freq: Every day | ORAL | Status: DC

## 2014-08-08 MED ORDER — ENSURE PUDDING PO PUDG: 1.0000 | Freq: Two times a day (BID) | ORAL | Status: DC

## 2014-08-08 NOTE — Progress Notes (Signed)
Medicare Important Message given? YES  (If response is "NO", the following Medicare IM given date fields will be blank)  Date Medicare IM given:  08/08/2014 Medicare IM given by: Jonnie Finner

## 2014-08-08 NOTE — Progress Notes (Signed)
CARE MANAGEMENT NOTE 08/08/2014  Patient:  Luke Johnson, Luke Johnson   Account Number:  [id number]  Date Initiated:  08/06/2014  Documentation initiated by:  Karl Bales  Subjective/Objective Assessment:   pt admitted with acute renal failure     Action/Plan:   SNF   Anticipated DC Date:  08/06/2014   Anticipated DC Plan:  SKILLED NURSING FACILITY  In-house referral  Clinical Social Worker      DC Planning Services  CM consult      Choice offered to / List presented to:             Status of service:  Completed, signed off Medicare Important Message given?  YES (If response is "NO", the following Medicare IM given date fields will be blank) Date Medicare IM given:  08/06/2014 Medicare IM given by:  Karl Bales Date Additional Medicare IM given:  08/08/2014 Additional Medicare IM given by:  Jonnie Finner  Discharge Disposition:  [facility]  Per UR Regulation:  Reviewed for med. necessity/level of care/duration of stay  If discussed at [facility] of Stay Meetings, dates discussed:    Comments:  08/08/2014 1030 No NCM needs identified. Jonnie Finner RN CCM Case Mgmt phone [phone]

## 2014-08-08 NOTE — Discharge Summary (Signed)
Physician Discharge Summary  Luke Johnson:[id number] DOB: 02-16-1926 DOA: 07/30/2014  PCP: [name], MD  Admit date: 07/30/2014 Discharge date: pending  Recommendations for Outpatient Follow-up:  1. To SNF for PT/OT 2. Follow up with urology in 2 weeks.  Please call to schedule appointment with [name] and assist with transportation.   3. F/u with hematology in 1-2 weeks, Dr. Lindi Adie regarding thrombocytopenia and anemia.  Please call to schedule appointment and assist with transportation.   4. Repeat BMP and CBC in 1 week 5. Continue ciprofloxacin through 10/27, then stop  Discharge Diagnoses:  Active Problems:   Acute on chronic renal failure   Acute encephalopathy   UTI (lower urinary tract infection)   Prostate cancer   HTN (hypertension)   Dyslipidemia   Hyperglycemia   Anemia   Protein-calorie malnutrition, severe   Discharge Condition: stable, improved  Diet recommendation: regular diet  Wt Readings from Last 3 Encounters:  08/02/14 75.5 kg (166 lb 7.2 oz)  08/02/14 75.5 kg (166 lb 7.2 oz)    History of present illness:   The patient is an 80 year old male with history of hypertension, blindness, prostate cancer, hyperlipidemia, anemia who presented from assisted living facility after he was found on the floor. The patient was unable to provide a reliable history. Per the assisted-living facility, he has not been acting like himself for the 2 days prior to admission. Head CT and CT of the cervical spine were unremarkable but he was found to have a urinary tract infection with a white blood cell count of 17,000. He was started on ceftriaxone. He also had acute on chronic renal failure with a creatinine of 4.4 without evidence of urinary obstruction a bladder scan.  Hospital Course:   Acute on chronic renal failure due to dehydration and obstructing kidney stone. His baseline creatinine was 1.9. His creatinine was 4.4 on admission and he was started on IV  fluids and antibiotics for his urinary tract infection. He was seen by urology after CT scan demonstrated left hydronephrosis and hydroureter secondary to a 6 mm stone at the left UVJ. He also had a large left renal calculus that was 2 cm in diameter. He underwent cystoscopy with left ureteroscopic stone removal with left retrograde pyelography by Doctor [name] on 10/21.  His creatinine trended down to his baseline by the time of discharge.  Sepsis due to proteus urinary tract infection. He was initially started on ceftriaxone but he was transitioned to ciprofloxacin based on the results of his urine culture. He should complete a seven-day course of antibiotics.  Severe protein calorie malnutrition, He has had poor appetite for several months with weight loss, but this acutely worsened during this hospitalization.  He met with nutrition who recommended a liberalized diet with supplements. He was started on mirtazapine. He gradually started eating more food and per his family is closer to his baseline.    Generalized weakness,  assessed by physical and occupational therapy and again by his assisted-living facility who felt that he would benefit from [facility]-term rehabilitation before returning to ALF.   Metabolic encephalopathy from urinary tract infection and acute kidney injury. CT head was negative for acute findings. He did have some anemia and thrombocytopenia however his smear was negative persistent sites suggesting a DIC, TTP, and HUS were not explanations for his confusion. He has had gradual improvement in his mentation.  Microcytic anemia, likely secondary to her renal parenchymal disease and hemoglobin remained stable at around 8.8 mg/dL.  IFE negative for monoclonal protein.  He will followup with hematology for further evaluation of his anemia.  Thrombocytopenia, platelets nadired around 50,000.  He was seen by hematology who felt that his thrombocytopenia was secondary to medication side  effect and acute illness from his acute renal failure. He did not have schistocytes so he likely did not have DIC, HUS, or TTP. His platelet count trended up gradually as he recovered from his acute illness. He should followup with hematology in approximately 2 weeks to repeat his CBC and ensure that he has had further improvement in his anemia and thrombocytopenia.  Hypertension, blood pressure medications were held secondary to low blood pressures.  Hyperglycemia, hemoglobin A1c was 5.6. He was initially started on sliding scale insulin but his glucose levels trended down to more normal range. His insulin was discontinued. Recommended he have a repeat hemoglobin A1c done in approximately 3-6 months. Given his age and comorbidities, his goal hemoglobin A1c would be approximately 7-1/2 and he is well below this target.  Hyperlipidemia, stable, continue statin.  Prostate cancer, stable, continue Casodex.  Diarrhea, likely secondary to urinary tract infection. His C. difficile PCR was negative. His diarrhea improved.    Consultants:  Nephrology  Heme Onc  Urology Procedures:  none Antibiotics:  Rocephin 10/17>>>10/19  Ciprofloxacin 10/19>>>   Discharge Exam: Filed Vitals:   08/08/14 0515  BP: 140/46  Pulse: 69  Temp: 98.5 F (36.9 C)  Resp: 20   Filed Vitals:   08/07/14 0523 08/07/14 1452 08/07/14 2328 08/08/14 0515  BP: 157/49 118/45 114/54 140/46  Pulse: 69 69 61 69  Temp: 98.9 F (37.2 C) 98.7 F (37.1 C) 98.9 F (37.2 C) 98.5 F (36.9 C)  TempSrc: Oral Oral Oral Oral  Resp: _0 Height:      Weight:      SpO2: 100% 100% 100% 98%    General:  Cachectic AAM, No acute distress, lying in bed, easily arouseable HEENT: NCAT, MMM  Cardiovascular: RRR, nl S1, S2 no mrg, 2+ pulses, warm extremities  Respiratory: CTAB, no increased WOB  Abdomen: NABS, soft, NT/ND  MSK: Normal tone and bulk, no LEE  Neuro: Diffusely weak, mouth drawn on right side   Discharge  Instructions      Discharge Instructions   Call MD for:  difficulty breathing, headache or visual disturbances    Complete by:  As directed      Call MD for:  extreme fatigue    Complete by:  As directed      Call MD for:  hives    Complete by:  As directed      Call MD for:  persistant dizziness or light-headedness    Complete by:  As directed      Call MD for:  persistant nausea and vomiting    Complete by:  As directed      Call MD for:  severe uncontrolled pain    Complete by:  As directed      Call MD for:  temperature >100.4    Complete by:  As directed      Diet general    Complete by:  As directed   Finger foods     Increase activity slowly    Complete by:  As directed             Medication List    STOP taking these medications       diltiazem 120 MG 24 hr capsule  Commonly  known as:  CARDIZEM CD     furosemide 20 MG tablet  Commonly known as:  LASIX     hydrALAZINE 50 MG tablet  Commonly known as:  APRESOLINE      TAKE these medications       aspirin EC 81 MG tablet  Take 81 mg by mouth every other day.     bicalutamide 50 MG tablet  Commonly known as:  CASODEX  Take 50 mg by mouth daily.     bisacodyl 5 MG EC tablet  Commonly known as:  DULCOLAX  Take 1 tablet (5 mg total) by mouth daily as needed for moderate constipation.     ciprofloxacin 500 MG tablet  Commonly known as:  CIPRO  Take 1 tablet (500 mg total) by mouth 2 (two) times daily.     cyanocobalamin 500 MCG tablet  Take 500 mcg by mouth daily.     eucerin cream  Apply 1 application topically 2 (two) times daily. Apply to affected areas as directed.     feeding supplement (ENSURE) Pudg  Take 1 Container by mouth 2 (two) times daily between meals.     feeding supplement (ENSURE COMPLETE) Liqd  Take 237 mLs by mouth daily.     FERRO-SEQUELS 50 MG CR tablet  Generic drug:  ferrous fumarate  Take 200 mg by mouth daily after breakfast.     mirtazapine 15 MG tablet  Commonly  known as:  REMERON  Take 1 tablet (15 mg total) by mouth at bedtime.     multivitamin with minerals Tabs tablet  Take 1 tablet by mouth daily.     sennosides-docusate sodium 8.6-50 MG tablet  Commonly known as:  SENOKOT-S  Take 1 tablet by mouth daily.     simvastatin 20 MG tablet  Commonly known as:  ZOCOR  Take 20 mg by mouth at bedtime.       Follow-up Information   Follow up with [name], MD. Schedule an appointment as soon as possible for a visit in 1 week.   Specialty:  Family Medicine   Contact information:   [address] [place] [address] [phone]       Follow up with Rulon Eisenmenger, MD. Schedule an appointment as soon as possible for a visit in 2 weeks.   Specialty:  Hematology and Oncology   Contact information:   [place] [address] [phone]        The results of significant diagnostics from this hospitalization (including imaging, microbiology, ancillary and laboratory) are listed below for reference.    Significant Diagnostic Studies: Ct Abdomen Pelvis Wo Contrast  08/03/2014   CLINICAL DATA:  LEFT ureteral calculus with hydronephrosis, personal history of prostate cancer with seed implantation, UTI, hypertensive heart and kidney disease, type 2 diabetes mellitus, hypertension  EXAM: CT ABDOMEN AND PELVIS WITHOUT CONTRAST  TECHNIQUE: Multidetector CT imaging of the abdomen and pelvis was performed following the standard protocol without IV contrast. Sagittal and coronal MPR images reconstructed from axial data set. Neither ulnar injury neck is contrast administered for this indication.  COMPARISON:  09/28/2010  FINDINGS: Bibasilar pleural effusions and atelectasis.  Significant BILATERAL gynecomastia  Atherosclerotic calcifications aorta and coronary arteries.  Nonobstructing large LEFT renal calculus up to 2 cm diameter.  LEFT hydronephrosis and hydroureter secondary to a 6 mm calculus at the LEFT  ureterovesical junction.  Bladder decompressed by Foley catheter.  No RIGHT hydronephrosis or urinary tract calcification.  Within  limits of a nonenhanced exam, liver, spleen, pancreas, RIGHT kidney, and adrenal glands unremarkable.  Large hiatal hernia.  Small ventral hernia in anterior inferior pelvis RIGHT of midline with herniation of fat into the subcutaneous soft tissue.  Normal appendix.  Stomach and bowel loops otherwise unremarkable for technique.  No additional mass, adenopathy, or free fluid.  Brachytherapy seed implants at prostate bed.  Mild stranding of the presacral space increased since previous exam.  Degenerative changes of both hips and scattered lumbar disc space levels.  IMPRESSION: LEFT hydronephrosis and hydroureter secondary to a 6 mm LEFT UVJ calculus.  Large LEFT renal calculus up to 2 cm diameter.  BILATERAL pleural effusions and atelectasis.  Significant BILATERAL gynecomastia; correlation with physical exam recommended to exclude mass.  Large hiatal hernia.  Small anterior inferior RIGHT pelvic ventral hernia containing fat.   Electronically Signed   By: Lavonia Dana M.D.   On: 08/03/2014 14:00   Dg Chest 2 View  07/30/2014   CLINICAL DATA:  Found down on the floor. Hyperglycemia with history of diabetes.  EXAM: CHEST  2 VIEW  COMPARISON:  12/2009  FINDINGS: Cardiac silhouette is upper limits of normal to mildly enlarged in size. Large hiatal hernia is again seen. The lungs are clear. No pleural effusion or pneumothorax is identified. No acute osseous abnormality is seen.  IMPRESSION: No active cardiopulmonary disease.  Hiatal hernia.   Electronically Signed   By: Logan Bores   On: 07/30/2014 14:44   Ct Head Wo Contrast  07/30/2014   CLINICAL DATA:  Initial evaluation for confusion, patient states that he fell but cannot remember within where or hila  EXAM: CT HEAD WITHOUT CONTRAST  CT CERVICAL SPINE WITHOUT CONTRAST  TECHNIQUE: Multidetector CT imaging of the head and cervical  spine was performed following the standard protocol without intravenous contrast. Multiplanar CT image reconstructions of the cervical spine were also generated.  COMPARISON:  06/28/2012  FINDINGS: CT HEAD FINDINGS  Moderate to severe atrophy. Mild low attenuation in the deep white matter. No hemorrhage or extra-axial fluid. No evidence of mass or vascular territory infarct. No hydrocephalus. Calvarium is intact.  CT CERVICAL SPINE FINDINGS  Multifocal intravenous air in the right neck likely due to IV access and injection. No other soft tissue abnormalities.  No fracture identified. No paraspinous hematoma. Normal anterior-posterior alignment. Multilevel degenerative disc disease. Degenerative change also involves posterior elements. Fusion of the posterior elements at C4 and C5 may be congenital.  IMPRESSION: No acute intracranial abnormality. No evidence of acute traumatic injury involving the cervical spine.   Electronically Signed   By: Skipper Cliche M.D.   On: 07/30/2014 14:42   Ct Cervical Spine Wo Contrast  07/30/2014   CLINICAL DATA:  Initial evaluation for confusion, patient states that he fell but cannot remember within where or hila  EXAM: CT HEAD WITHOUT CONTRAST  CT CERVICAL SPINE WITHOUT CONTRAST  TECHNIQUE: Multidetector CT imaging of the head and cervical spine was performed following the standard protocol without intravenous contrast. Multiplanar CT image reconstructions of the cervical spine were also generated.  COMPARISON:  06/28/2012  FINDINGS: CT HEAD FINDINGS  Moderate to severe atrophy. Mild low attenuation in the deep white matter. No hemorrhage or extra-axial fluid. No evidence of mass or vascular territory infarct. No hydrocephalus. Calvarium is intact.  CT CERVICAL SPINE FINDINGS  Multifocal intravenous air in the right neck likely due to IV access and injection. No other soft tissue abnormalities.  No fracture identified. No paraspinous  hematoma. Normal anterior-posterior  alignment. Multilevel degenerative disc disease. Degenerative change also involves posterior elements. Fusion of the posterior elements at C4 and C5 may be congenital.  IMPRESSION: No acute intracranial abnormality. No evidence of acute traumatic injury involving the cervical spine.   Electronically Signed   By: Skipper Cliche M.D.   On: 07/30/2014 14:42   US Renal  07/31/2014   CLINICAL DATA:  Renal failure.  EXAM: RENAL/URINARY TRACT ULTRASOUND COMPLETE  COMPARISON:  CT abdomen and pelvis 09/28/2010.  FINDINGS: Right Kidney:  Length: 8.5 cm. Cortical echogenicity appears increased. No hydronephrosis is identified. 0.8 cm cyst is incidentally noted.  Left Kidney:  Length: 11.6 cm. 1.2 cm echogenic focus with posterior shadowing compatible with a stone is seen the lower pole. Multiple additional calcifications are seen in the left kidney measuring up to 2.0 cm. There is mild to moderate left hydronephrosis is identified.  Bladder:  Decompressed with a Foley catheter in place.  Small bilateral pleural effusions are identified.  IMPRESSION: Mild to moderate left hydronephrosis with multiple nonobstructing stone seen in the left kidney. Cause for hydronephrosis is not identified.  Cortical thinning of the right kidney with increased echogenicity consistent with medical renal disease.  Small bilateral pleural effusions.   Electronically Signed   By: Inge Rise M.D.   On: 07/31/2014 13:02   Dg Chest Port 1 View  08/01/2014   CLINICAL DATA:  Shortness of breath.  EXAM: PORTABLE CHEST - 1 VIEW  COMPARISON:  PA and lateral chest 07/30/2014 and 01/09/2010.  FINDINGS: Large hiatal hernia is again seen. There is some left basilar atelectasis. No consolidative process, pneumothorax or effusion. Cardiomegaly noted.  IMPRESSION: Cardiomegaly without acute disease.  Hiatal hernia.   Electronically Signed   By: Inge Rise M.D.   On: 08/01/2014 15:21    Microbiology: Recent Results (from the past 240  hour(s))  URINE CULTURE     Status: None   Collection Time    07/30/14  1:52 PM      Result Value Ref Range Status   Specimen Description URINE, CLEAN CATCH   Final   Special Requests NONE   Final   Culture  Setup Time     Final   Value: 07/30/2014 21:45     Performed at [facility]     Final   Value: >=100,000 COLONIES/ML     Performed at [facility]   Culture     Final   Value: PROTEUS MIRABILIS     Performed at [facility]   Report Status 08/01/2014 FINAL   Final   Organism ID, Bacteria PROTEUS MIRABILIS   Final  MRSA PCR SCREENING     Status: None   Collection Time    07/30/14 10:11 PM      Result Value Ref Range Status   MRSA by PCR NEGATIVE  NEGATIVE Final   Comment:            The GeneXpert MRSA Assay (FDA     approved for NASAL specimens     only), is one component of a     comprehensive MRSA colonization     surveillance program. It is not     intended to diagnose MRSA     infection nor to guide or     monitor treatment for     MRSA infections.  CLOSTRIDIUM DIFFICILE BY PCR     Status: None   Collection Time    08/01/14  11:40 AM      Result Value Ref Range Status   C difficile by pcr NEGATIVE  NEGATIVE Final   Comment: Performed at [facility]  SURGICAL PCR SCREEN     Status: Abnormal   Collection Time    08/04/14  2:25 PM      Result Value Ref Range Status   MRSA, PCR NEGATIVE  NEGATIVE Final   Staphylococcus aureus POSITIVE (*) NEGATIVE Final   Comment:            The Xpert SA Assay (FDA     approved for NASAL specimens     in patients over 74 years of age),     is one component of     a comprehensive surveillance     program.  Test performance has     been validated by [employer] for patients greater     than or equal to 58 year old.     It is not intended     to diagnose infection nor to     guide or monitor treatment.     Labs: Basic Metabolic Panel:  Recent Labs Lab 08/02/14 0430  08/03/14 0512 08/04/14 0408 08/05/14 0400 08/06/14 0433  NA 139 144 138 137 138  K 3.4* 3.3* 3.1* 3.5* 3.4*  CL 97 99 96 96 98  CO2 31 33* 33* 32 30  GLUCOSE 136* 169* 155* 203* 188*  BUN 67* 59* 53* 46* 40*  CREATININE 2.84* 2.61* 2.29* 1.98* 1.87*  CALCIUM 7.6* 7.8* 7.7* 7.6* 7.6*  PHOS 3.1 2.9 2.6 3.0 2.7   Liver Function Tests:  Recent Labs Lab 08/02/14 0430 08/03/14 0512 08/04/14 0408 08/05/14 0400 08/06/14 0433  AST 36  --   --   --   --   ALT 29  --   --   --   --   ALKPHOS 103  --   --   --   --   BILITOT 0.2*  --   --   --   --   PROT 5.5*  --   --   --   --   ALBUMIN 1.9* 1.9* 1.8* 1.8* 1.9*   No results found for this basename: LIPASE, AMYLASE,  in the last 168 hours No results found for this basename: AMMONIA,  in the last 168 hours CBC:  Recent Labs Lab 08/02/14 0430 08/03/14 0512 08/04/14 0408 08/05/14 0400 08/06/14 0433  WBC 7.5 6.5 8.2 9.1 8.3  HGB 8.9* 8.7* 8.8* 8.8* 9.1*  HCT 26.2* 26.7* 26.7* 27.1* 28.0*  MCV 77.3* 79.7 78.3 79.0 80.2  PLT 55* 52* 63* 79* 123*   Cardiac Enzymes: No results found for this basename: CKTOTAL, CKMB, CKMBINDEX, TROPONINI,  in the last 168 hours BNP: BNP (last 3 results) No results found for this basename: PROBNP,  in the last 8760 hours CBG:  Recent Labs Lab 08/04/14 1721 08/04/14 2213 08/05/14 0747 08/05/14 1226 08/05/14 1718  GLUCAP 194* 198* 147* 156* 137*    Time coordinating discharge: 45 minutes  Signed:  Jaymz Traywick  [facility] 08/08/2014, 9:21 AM

## 2014-08-08 NOTE — Progress Notes (Signed)
Report called to [employer]. Pt picked up by EMS. IV Removed. EMS updated.

## 2014-08-08 NOTE — Clinical Social Work Note (Signed)
Patient for d/c today to SNF bed at [facility]. HCPOA agreeable to this plan- plan transfer via EMS. Eduard Clos, MSW, [place]

## 2014-08-13 ENCOUNTER — Telehealth: Payer: Self-pay | Admitting: Oncology

## 2014-08-13 NOTE — Telephone Encounter (Signed)
S/W TAMMY AT [facility] AND GAVE NP APPT FOR 11/06 @ 1:30 W/DR. SHADAD.  DX- PROSTATE CA

## 2014-08-16 ENCOUNTER — Telehealth: Payer: Self-pay | Admitting: Oncology

## 2014-08-16 NOTE — Telephone Encounter (Signed)
S/W [facility] AND GAVE NEW D/T FOR APPT 11/11 @ 10:30 W/DR. SHADAD.  C/D ON 11/2 FOR NP APPT FOR 11/11

## 2014-08-24 ENCOUNTER — Telehealth: Payer: Self-pay | Admitting: Medical Oncology

## 2014-08-24 NOTE — Telephone Encounter (Signed)
Call to number listed, informed patient is in rehab facility at this time. Informed them that patient has appt tomorrow @ [place]. Was informed someone will call back to give our office number to rehab facility patient is at.

## 2014-08-25 ENCOUNTER — Ambulatory Visit (HOSPITAL_BASED_OUTPATIENT_CLINIC_OR_DEPARTMENT_OTHER): Payer: Medicare Other | Admitting: Oncology

## 2014-08-25 ENCOUNTER — Ambulatory Visit: Payer: Medicare Other

## 2014-08-25 ENCOUNTER — Encounter: Payer: Self-pay | Admitting: Oncology

## 2014-08-25 ENCOUNTER — Other Ambulatory Visit: Payer: Medicare Other

## 2014-08-25 VITALS — BP 146/58 | HR 68 | Temp 98.0°F | Resp 18 | Ht 66.0 in | Wt 152.2 lb

## 2014-08-25 DIAGNOSIS — C61 Malignant neoplasm of prostate: Secondary | ICD-10-CM

## 2014-08-25 DIAGNOSIS — D696 Thrombocytopenia, unspecified: Secondary | ICD-10-CM

## 2014-08-25 DIAGNOSIS — D649 Anemia, unspecified: Secondary | ICD-10-CM

## 2014-08-25 NOTE — Progress Notes (Signed)
Checked in new pt with no financial concerns at this time.  Pt has 2 insurances so financial assistance may not be needed but he has my card for any questions or concerns. ° °

## 2014-08-25 NOTE — Progress Notes (Signed)
Please see consult note.  

## 2014-08-25 NOTE — Consult Note (Signed)
Reason for Referral: prostate cancer.  HPI: 80 year old gentleman native of [place] and currently resides in a skilled nursing facility. He has a history of hypertension diabetes and chronic renal insufficiency. His history of prostate cancer dates back to mid 90s where he was treated with brachytherapy as definitive approach. The details of his therapy is not available to me. He developed a biochemical relapse in 2012 with a negative bone scan. He is currently on bicalutamide as a monotherapy. He was hospitalized recently in October 2015 with symptoms of worsening renal failure, UTI and was found to be anemic and thrombocytopenic. His anemia thrombocytopenia resolved spontaneously and likely reactive. He also underwent left ureteroscopy and removal of a distal left ureteral stone. Since his discharge, he is asymptomatic. He is voiding freely without any hematuria or dysuria. He does not report any headaches or blurry vision or syncope. He does not report any fevers, chills, sweats or weight loss. His appetite had been reasonable. His mobility is quite limited but uses a walker in the nursing home. He does not report any chest pain, palpitation, orthopnea or PND. He does not report any shortness of breath or cough. He does not report any nausea, vomiting, abdominal pain or dyspepsia. He does not report any frequency urgency or hesitancy. He is not report any skeletal complaints of arthralgias or myalgias. Rest of his review of systems unremarkable.  Past Medical History  Diagnosis Date  . 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 Chronic kidney disease, stage III (moderate)   . Legal blindness, as defined in [place]   . Carcinoma in situ of prostate   . Anemia, unspecified   . Type II or unspecified type diabetes mellitus with renal manifestations, not stated as uncontrolled   . Other  and unspecified hyperlipidemia   . Edema   . Obesity, unspecified   . Diabetes mellitus   . Dyslipidemia   :  Past Surgical History  Procedure Laterality Date  . No past surgeries    . Cystoscopy/retrograde/ureteroscopy/stone extraction with basket Left 08/04/2014    Procedure: CYSTOSCOPY/RETROGRADE/URETEROSCOPY/STONE EXTRACTION WITH BASKET;  Surgeon: Raynelle Bring, MD;  Location: [facility];  Service: Urology;  Laterality: Left;  :   Current Outpatient Prescriptions  Medication Sig Dispense Refill  . aspirin EC 81 MG tablet Take 81 mg by mouth every other day.     . bicalutamide (CASODEX) 50 MG tablet Take 50 mg by mouth daily.    . bisacodyl (DULCOLAX) 5 MG EC tablet Take 1 tablet (5 mg total) by mouth daily as needed for moderate constipation. 30 tablet 0  . ciprofloxacin (CIPRO) 500 MG tablet Take 1 tablet (500 mg total) by mouth 2 (two) times daily. 10 tablet 0  . cyanocobalamin 500 MCG tablet Take 500 mcg by mouth daily.    Marland Kitchen diltiazem (CARDIZEM CD) 120 MG 24 hr capsule     . feeding supplement, ENSURE COMPLETE, (ENSURE COMPLETE) LIQD Take 237 mLs by mouth daily. 30 Bottle 0  . feeding supplement, ENSURE, (ENSURE) PUDG Take 1 Container by mouth 2 (two) times daily between meals. 60 Can 0  . ferrous fumarate (FERRO-SEQUELS) 50 MG CR tablet Take 200 mg by mouth daily after breakfast.     . furosemide (LASIX) 20 MG tablet     . HUMULIN 70/30 (70-30) 100 UNIT/ML injection     . hydrALAZINE (APRESOLINE) 50 MG tablet     .  mirtazapine (REMERON) 15 MG tablet Take 1 tablet (15 mg total) by mouth at bedtime. 30 tablet 0  . Multiple Vitamin (MULTIVITAMIN WITH MINERALS) TABS Take 1 tablet by mouth daily.    . sennosides-docusate sodium (SENOKOT-S) 8.6-50 MG tablet Take 1 tablet by mouth daily.    . simvastatin (ZOCOR) 10 MG tablet     . simvastatin (ZOCOR) 20 MG tablet Take 20 mg by mouth at bedtime.    . Skin Protectants, Misc. (EUCERIN) cream Apply 1 application topically 2 (two) times  daily. Apply to affected areas as directed.     No current facility-administered medications for this visit.     No Known Allergies:  No family history on file.:  History   Social History  . Marital Status: Single    Spouse Name: N/A    Number of Children: N/A  . Years of Education: N/A   Occupational History  . Not on file.   Social History Main Topics  . Smoking status: Former Research scientist (life sciences)  . Smokeless tobacco: Never Used  . Alcohol Use: No  . Drug Use: No  . Sexual Activity: No   Other Topics Concern  . Not on file   Social History Narrative  . No narrative on file  :  Pertinent items are noted in HPI.  Exam: ECOG 3 Blood pressure 146/58, pulse 68, temperature 98 F (36.7 C), temperature source Oral, resp. rate 18, height 5\' 6"  (1.676 m), weight 152 lb 3.2 oz (69.037 kg). General appearance: alert and cooperative Head: Normocephalic, without obvious abnormality Throat: lips, mucosa, and tongue normal; teeth and gums normal Neck: no adenopathy, no carotid bruit and no JVD Back: negative Resp: clear to auscultation bilaterally Chest wall: no tenderness Cardio: regular rate and rhythm, S1, S2 normal, no murmur, click, rub or gallop GI: soft, non-tender; bowel sounds normal; no masses,  no organomegaly Extremities: extremities normal, atraumatic, no cyanosis or edema Pulses: 2+ and symmetric Skin: Skin color, texture, turgor normal. No rashes or lesions   CBC    Component Value Date/Time   WBC 8.3 08/06/2014 0433   RBC 3.49* 08/06/2014 0433   RBC 3.57* 07/30/2014 2037   HGB 9.1* 08/06/2014 0433   HCT 28.0* 08/06/2014 0433   PLT 123* 08/06/2014 0433   MCV 80.2 08/06/2014 0433   MCH 26.1 08/06/2014 0433   MCHC 32.5 08/06/2014 0433   RDW 14.3 08/06/2014 0433   LYMPHSABS 0.9 01/09/2010 1905   MONOABS 0.5 01/09/2010 1905   EOSABS 0.6 01/09/2010 1905   BASOSABS 0.1 01/09/2010 1905    Results for LORING, LISKEY (MRN [id number]) as of 08/25/2014 11:08  Ref.  Range 08/01/2014 12:07  PSA Latest Range: <=4.00 ng/mL 4.46 (H)    Ct Abdomen Pelvis Wo Contrast  08/03/2014   CLINICAL DATA:  LEFT ureteral calculus with hydronephrosis, personal history of prostate cancer with seed implantation, UTI, hypertensive heart and kidney disease, type 2 diabetes mellitus, hypertension  EXAM: CT ABDOMEN AND PELVIS WITHOUT CONTRAST  TECHNIQUE: Multidetector CT imaging of the abdomen and pelvis was performed following the standard protocol without IV contrast. Sagittal and coronal MPR images reconstructed from axial data set. Neither ulnar injury neck is contrast administered for this indication.  COMPARISON:  09/28/2010  FINDINGS: Bibasilar pleural effusions and atelectasis.  Significant BILATERAL gynecomastia  Atherosclerotic calcifications aorta and coronary arteries.  Nonobstructing large LEFT renal calculus up to 2 cm diameter.  LEFT hydronephrosis and hydroureter secondary to a 6 mm calculus at the LEFT ureterovesical junction.  Bladder decompressed by Foley catheter.  No RIGHT hydronephrosis or urinary tract calcification.  Within limits of a nonenhanced exam, liver, spleen, pancreas, RIGHT kidney, and adrenal glands unremarkable.  Large hiatal hernia.  Small ventral hernia in anterior inferior pelvis RIGHT of midline with herniation of fat into the subcutaneous soft tissue.  Normal appendix.  Stomach and bowel loops otherwise unremarkable for technique.  No additional mass, adenopathy, or free fluid.  Brachytherapy seed implants at prostate bed.  Mild stranding of the presacral space increased since previous exam.  Degenerative changes of both hips and scattered lumbar disc space levels.  IMPRESSION: LEFT hydronephrosis and hydroureter secondary to a 6 mm LEFT UVJ calculus.  Large LEFT renal calculus up to 2 cm diameter.  BILATERAL pleural effusions and atelectasis.  Significant BILATERAL gynecomastia; correlation with physical exam recommended to exclude mass.  Large hiatal  hernia.  Small anterior inferior RIGHT pelvic ventral hernia containing fat.   Electronically Signed   By: Lavonia Dana M.D.   On: 08/03/2014 14:00    Ct Head Wo Contrast  07/30/2014   CLINICAL DATA:  Initial evaluation for confusion, patient states that he fell but cannot remember within where or hila  EXAM: CT HEAD WITHOUT CONTRAST  CT CERVICAL SPINE WITHOUT CONTRAST  TECHNIQUE: Multidetector CT imaging of the head and cervical spine was performed following the standard protocol without intravenous contrast. Multiplanar CT image reconstructions of the cervical spine were also generated.  COMPARISON:  06/28/2012  FINDINGS: CT HEAD FINDINGS  Moderate to severe atrophy. Mild low attenuation in the deep white matter. No hemorrhage or extra-axial fluid. No evidence of mass or vascular territory infarct. No hydrocephalus. Calvarium is intact.  CT CERVICAL SPINE FINDINGS  Multifocal intravenous air in the right neck likely due to IV access and injection. No other soft tissue abnormalities.  No fracture identified. No paraspinous hematoma. Normal anterior-posterior alignment. Multilevel degenerative disc disease. Degenerative change also involves posterior elements. Fusion of the posterior elements at C4 and C5 may be congenital.  IMPRESSION: No acute intracranial abnormality. No evidence of acute traumatic injury involving the cervical spine.   Electronically Signed   By: Skipper Cliche M.D.   On: 07/30/2014 14:42   Ct Cervical Spine Wo Contrast  07/30/2014   CLINICAL DATA:  Initial evaluation for confusion, patient states that he fell but cannot remember within where or hila  EXAM: CT HEAD WITHOUT CONTRAST  CT CERVICAL SPINE WITHOUT CONTRAST  TECHNIQUE: Multidetector CT imaging of the head and cervical spine was performed following the standard protocol without intravenous contrast. Multiplanar CT image reconstructions of the cervical spine were also generated.  COMPARISON:  06/28/2012  FINDINGS: CT HEAD  FINDINGS  Moderate to severe atrophy. Mild low attenuation in the deep white matter. No hemorrhage or extra-axial fluid. No evidence of mass or vascular territory infarct. No hydrocephalus. Calvarium is intact.  CT CERVICAL SPINE FINDINGS  Multifocal intravenous air in the right neck likely due to IV access and injection. No other soft tissue abnormalities.  No fracture identified. No paraspinous hematoma. Normal anterior-posterior alignment. Multilevel degenerative disc disease. Degenerative change also involves posterior elements. Fusion of the posterior elements at C4 and C5 may be congenital.  IMPRESSION: No acute intracranial abnormality. No evidence of acute traumatic injury involving the cervical spine.   Electronically Signed   By: Skipper Cliche M.D.   On: 07/30/2014 14:42     Assessment and Plan:   80 year old gentleman with the following issues:  1. Prostate cancer  diagnosed in the mid 90s and treated with brachii therapy as a definitive treatment. Most recently, he developed biochemical relapse with a PSA around 6 in 2012. And he is currently on bicalutamide as a single agent. His most recent PSA was 4.46 08/01/2014. He did have a rather extensive imaging studies including CT of the cervical spine, CT head as well as CT scan abdomen and pelvis. I could not see any evidence to suggest metastatic disease or any measurable disease at this time. The natural course of this disease was discussed with the patient and for the time being I do not suggest any changes. Given his stable clinical status from a prostate cancer standpoint I do not recommend changing his bicalutamide. It is certainly reasonable to consider second line hormonal manipulation in the future if his PSA continues to rise. I will check his PSA in about 4 months after a visit. Other agents that can be used include ketoconazole, extending possibly Zytiga. He is not a candidate for systemic chemotherapy.  2. Anemia and  thrombocytopenia: This is likely reactive in nature due to acute illness and sepsis. These appear have resolved after his discharge.

## 2016-02-18 IMAGING — CT CT HEAD W/O CM
6 of 8 series · 18 of 47 positions shown, 19 images · non-contrast
Comparison: 06/28/2012

CLINICAL DATA: Initial evaluation for confusion, patient states
that he fell but cannot remember within where or hila

EXAM:
CT HEAD WITHOUT CONTRAST
CT CERVICAL SPINE WITHOUT CONTRAST
TECHNIQUE: Multidetector CT imaging of the head and cervical spine was
performed following the standard protocol without intravenous
contrast. Multiplanar CT image reconstructions of the cervical spine
were also generated.

[Series 4: bone windows · axial · 0.44mm/px · z∈[-48,+24]mm · 3 of 49 slices shown (1 of 2)]
[im 13/49  bone]
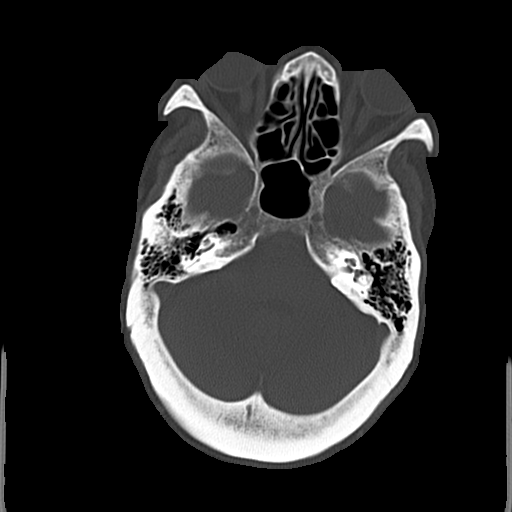
[im 25/49  bone]
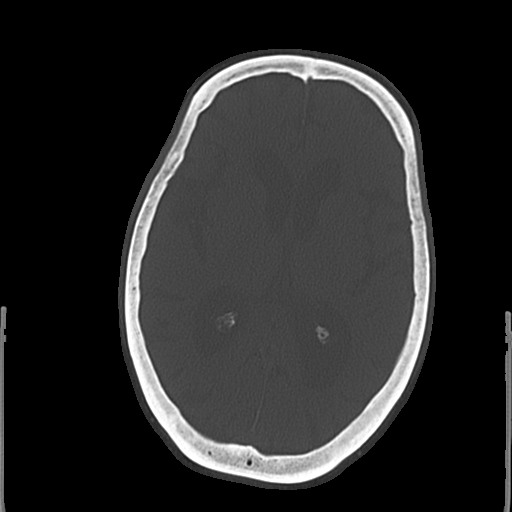
[im 37/49  bone]
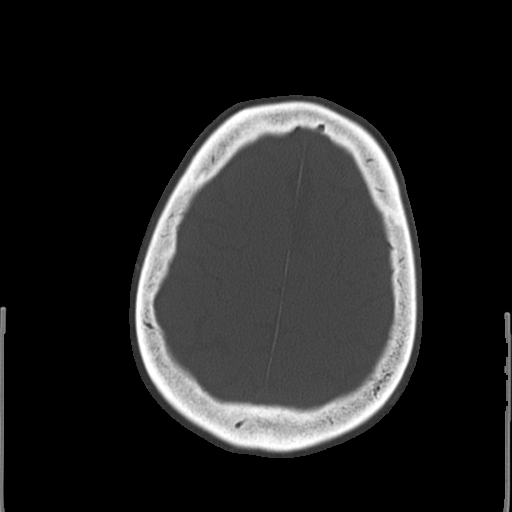

[Series 5: c-spine st · axial · 0.28mm/px · z∈[-219,-197]mm · 2 of 81 slices shown]
[im 12/81  brain]
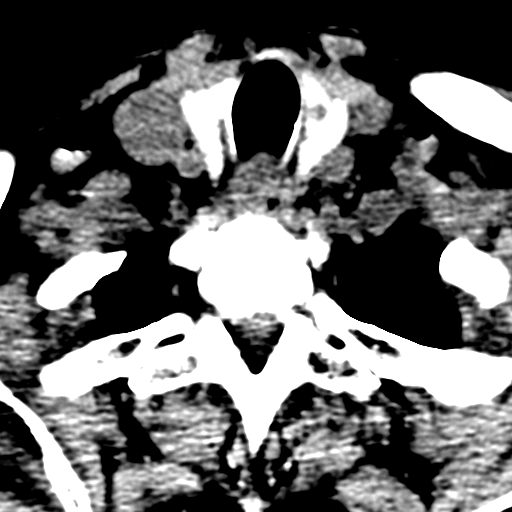
[im 23/81  brain]
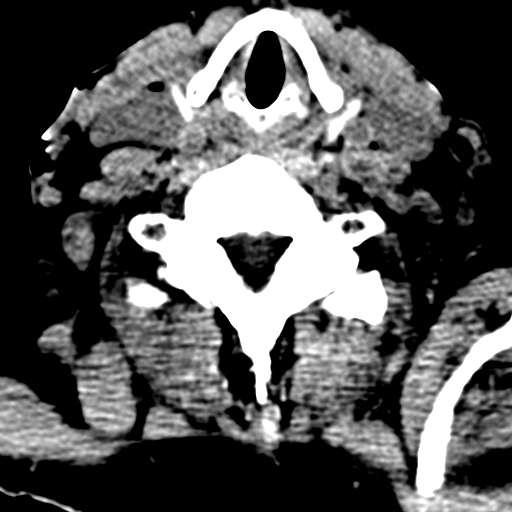

[Series 9: bone windows · axial · 0.44mm/px · z∈[-48,+24]mm · 3 of 49 slices shown (2 of 2)]
[im 13/49  bone]
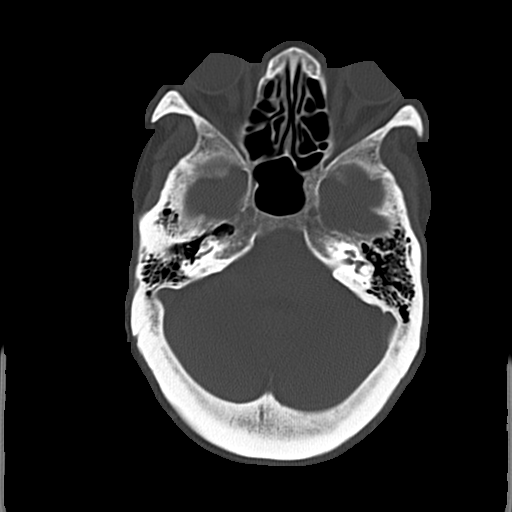
[im 25/49  bone]
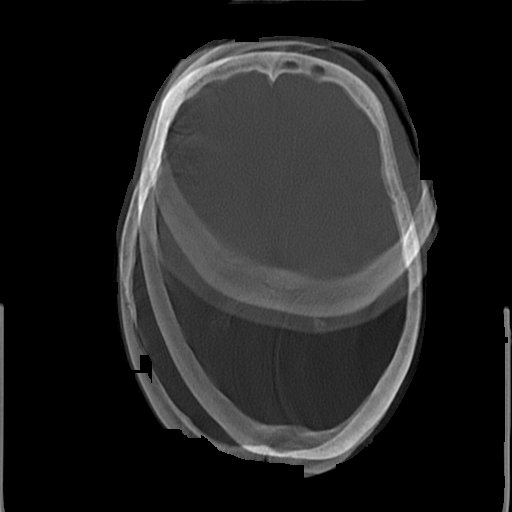
[im 37/49  bone]
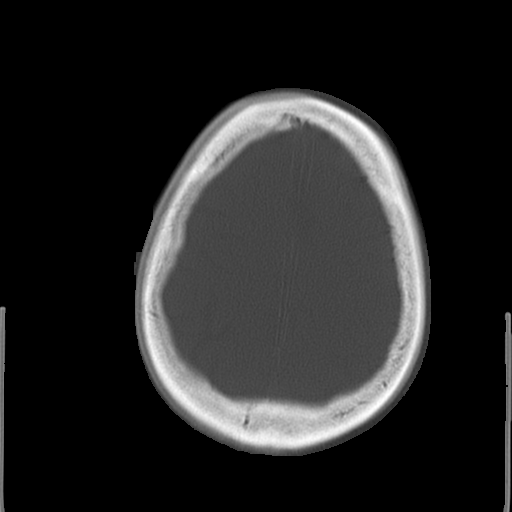

[Series 602: <mpr thick range> · coronal · 0.31mm/px · 3 of 57 slices shown]
[im 19/57  brain]
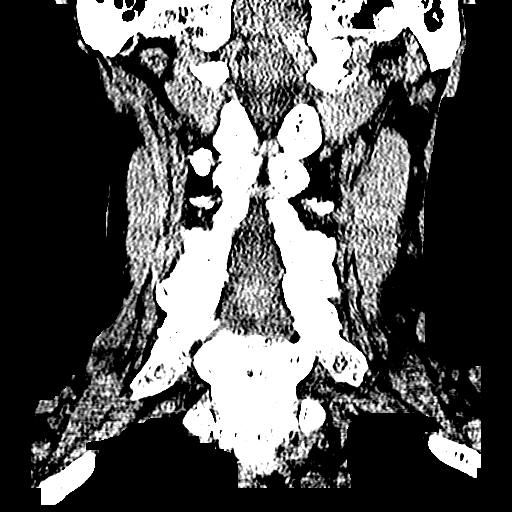
[im 25/57  brain]
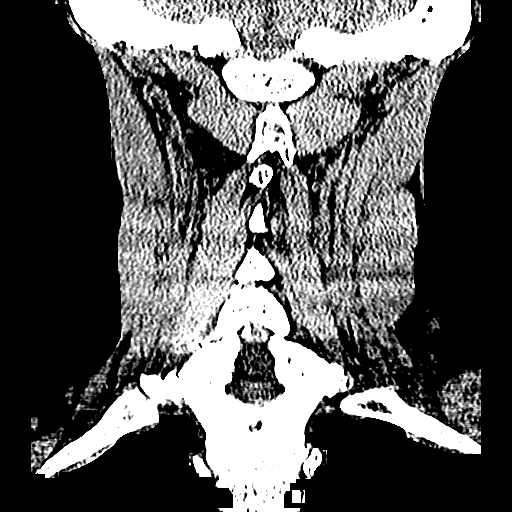
[im 32/57  brain]
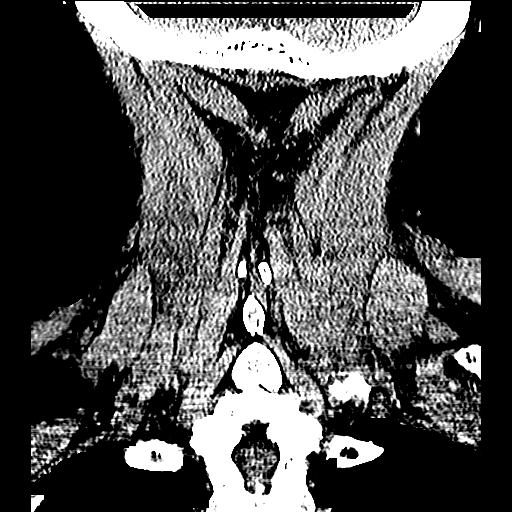

[Series 603: <mpr thick range(1)> · axial · 0.31mm/px · z∈[-233,-127]mm · 4 of 61 slices shown, 5 images]
[im 13/61  brain]
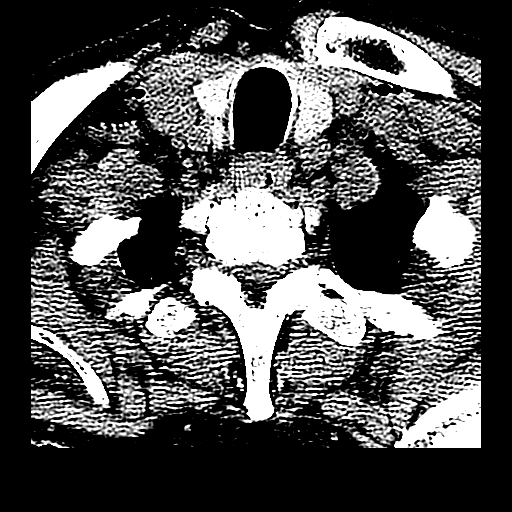
[im 13/61  bone]
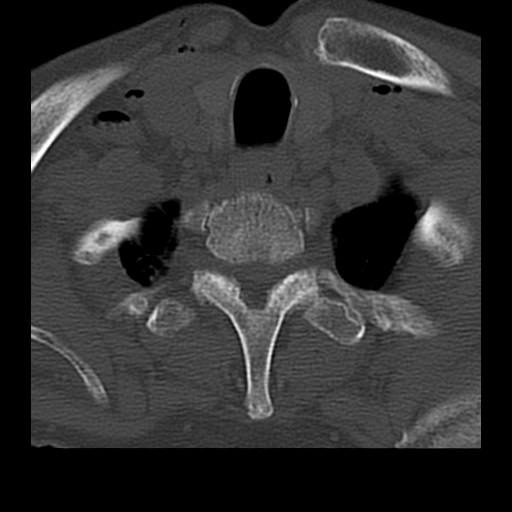
[im 25/61  brain]
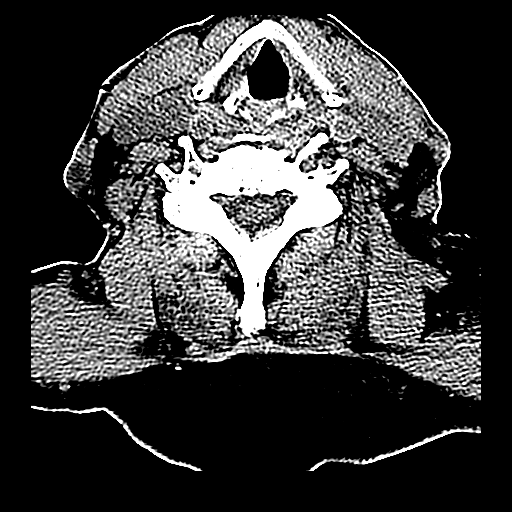
[im 37/61  brain]
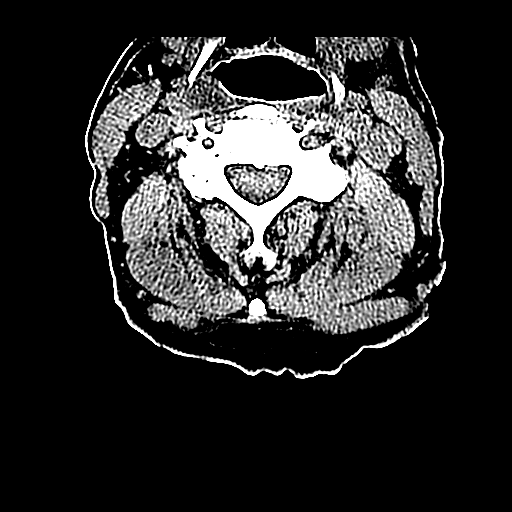
[im 49/61  brain]
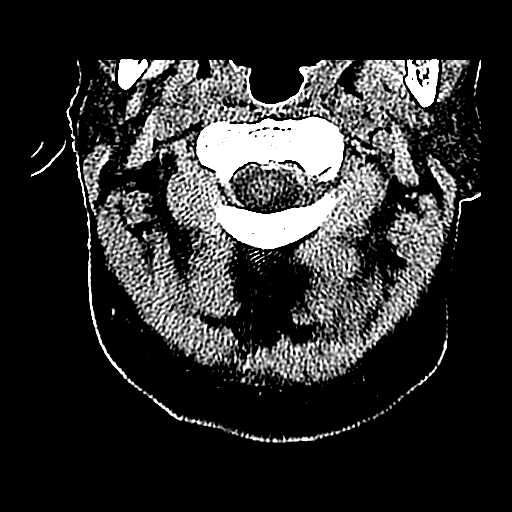

[Series 604: <mpr thick range(2)> · sagittal · 0.31mm/px · 3 of 54 slices shown]
[im 18/54  brain]
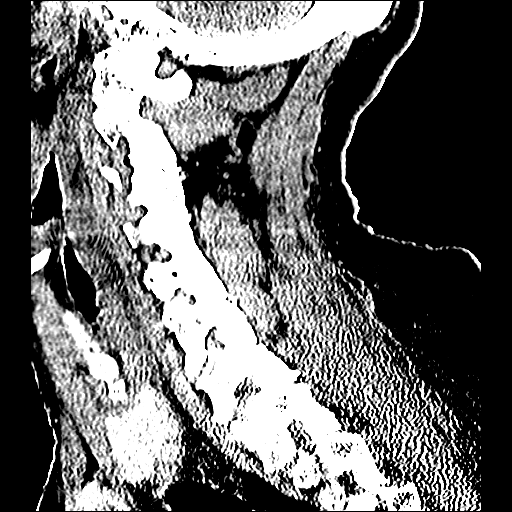
[im 27/54  brain]
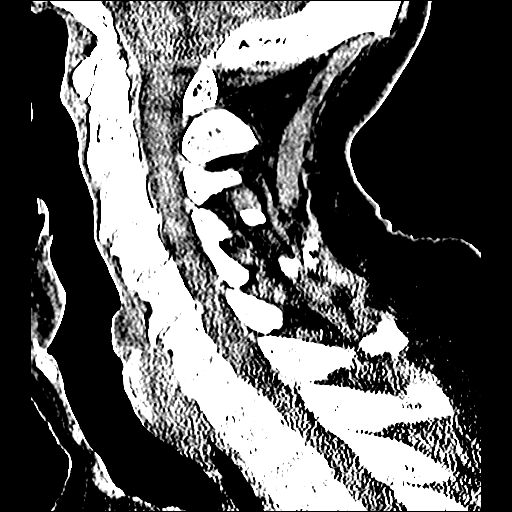
[im 36/54  brain]
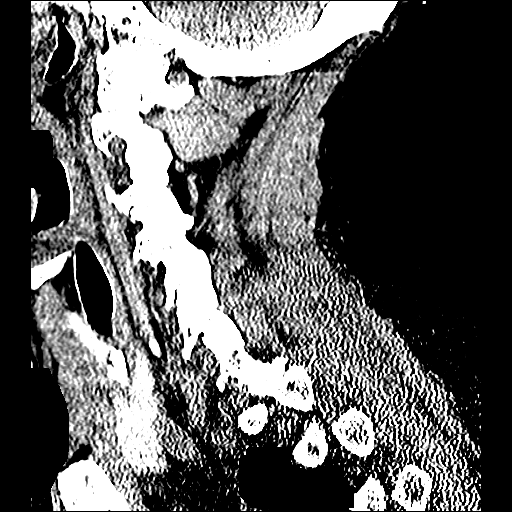

[18 of 47 positions shown; findings below may reference images not displayed]

FINDINGS: CT HEAD FINDINGS

Moderate to severe atrophy. Mild low attenuation in the deep white
matter. No hemorrhage or extra-axial fluid. No evidence of mass or
vascular territory infarct. No hydrocephalus. Calvarium is intact.

CT CERVICAL SPINE FINDINGS

Multifocal intravenous air in the right neck likely due to IV access
and injection. No other soft tissue abnormalities.

No fracture identified. No paraspinous hematoma. Normal
anterior-posterior alignment. Multilevel degenerative disc disease.
Degenerative change also involves posterior elements. Fusion of the
posterior elements at C4 and C[DATE] be congenital.
IMPRESSION: No acute intracranial abnormality. No evidence of acute traumatic
injury involving the cervical spine.

## 2017-05-22 ENCOUNTER — Inpatient Hospital Stay (HOSPITAL_COMMUNITY)
Admission: EM | Admit: 2017-05-22 | Discharge: 2017-05-28 | DRG: 375 | Disposition: A | Discharge status: Skilled Nursing Facility | Payer: Medicare Other | Attending: Internal Medicine | Admitting: Internal Medicine

## 2017-05-22 ENCOUNTER — Encounter (HOSPITAL_COMMUNITY): Payer: Self-pay

## 2017-05-22 DIAGNOSIS — D649 Anemia, unspecified: Secondary | ICD-10-CM

## 2017-05-22 DIAGNOSIS — D62 Acute posthemorrhagic anemia: Secondary | ICD-10-CM | POA: Diagnosis present

## 2017-05-22 DIAGNOSIS — E785 Hyperlipidemia, unspecified: Secondary | ICD-10-CM | POA: Diagnosis present

## 2017-05-22 DIAGNOSIS — H548 Legal blindness, as defined in USA: Secondary | ICD-10-CM | POA: Diagnosis present

## 2017-05-22 DIAGNOSIS — C20 Malignant neoplasm of rectum: Secondary | ICD-10-CM | POA: Diagnosis not present

## 2017-05-22 DIAGNOSIS — N2889 Other specified disorders of kidney and ureter: Secondary | ICD-10-CM

## 2017-05-22 DIAGNOSIS — E119 Type 2 diabetes mellitus without complications: Secondary | ICD-10-CM

## 2017-05-22 DIAGNOSIS — Z923 Personal history of irradiation: Secondary | ICD-10-CM

## 2017-05-22 DIAGNOSIS — E11649 Type 2 diabetes mellitus with hypoglycemia without coma: Secondary | ICD-10-CM | POA: Diagnosis present

## 2017-05-22 DIAGNOSIS — C61 Malignant neoplasm of prostate: Secondary | ICD-10-CM | POA: Diagnosis present

## 2017-05-22 DIAGNOSIS — I10 Essential (primary) hypertension: Secondary | ICD-10-CM | POA: Diagnosis present

## 2017-05-22 DIAGNOSIS — K5730 Diverticulosis of large intestine without perforation or abscess without bleeding: Secondary | ICD-10-CM | POA: Diagnosis present

## 2017-05-22 DIAGNOSIS — K625 Hemorrhage of anus and rectum: Secondary | ICD-10-CM | POA: Diagnosis present

## 2017-05-22 DIAGNOSIS — Z794 Long term (current) use of insulin: Secondary | ICD-10-CM

## 2017-05-22 DIAGNOSIS — Z87891 Personal history of nicotine dependence: Secondary | ICD-10-CM

## 2017-05-22 DIAGNOSIS — I1310 Hypertensive heart and chronic kidney disease without heart failure, with stage 1 through stage 4 chronic kidney disease, or unspecified chronic kidney disease: Secondary | ICD-10-CM | POA: Diagnosis present

## 2017-05-22 DIAGNOSIS — C775 Secondary and unspecified malignant neoplasm of intrapelvic lymph nodes: Secondary | ICD-10-CM | POA: Diagnosis present

## 2017-05-22 DIAGNOSIS — N183 Chronic kidney disease, stage 3 unspecified: Secondary | ICD-10-CM

## 2017-05-22 DIAGNOSIS — K6289 Other specified diseases of anus and rectum: Secondary | ICD-10-CM

## 2017-05-22 DIAGNOSIS — Z7982 Long term (current) use of aspirin: Secondary | ICD-10-CM

## 2017-05-22 DIAGNOSIS — E1122 Type 2 diabetes mellitus with diabetic chronic kidney disease: Secondary | ICD-10-CM | POA: Diagnosis present

## 2017-05-22 LAB — PROTIME-INR
INR: 1.07
Prothrombin Time: 13.9 seconds (ref 11.4–15.2)

## 2017-05-22 LAB — HEMOGLOBIN A1C
HEMOGLOBIN A1C: 6.2 % — AB (ref 4.8–5.6)
MEAN PLASMA GLUCOSE: 131.24 mg/dL

## 2017-05-22 LAB — CBC
HCT: 33.9 % — ABNORMAL LOW (ref 39.0–52.0)
Hemoglobin: 10.7 g/dL — ABNORMAL LOW (ref 13.0–17.0)
MCH: 25.7 pg — ABNORMAL LOW (ref 26.0–34.0)
MCHC: 31.6 g/dL (ref 30.0–36.0)
MCV: 81.3 fL (ref 78.0–100.0)
PLATELETS: 171 10*3/uL (ref 150–400)
RBC: 4.17 MIL/uL — ABNORMAL LOW (ref 4.22–5.81)
RDW: 14.4 % (ref 11.5–15.5)
WBC: 4.4 10*3/uL (ref 4.0–10.5)

## 2017-05-22 LAB — COMPREHENSIVE METABOLIC PANEL
ALT: 14 U/L — AB (ref 17–63)
AST: 25 U/L (ref 15–41)
Albumin: 3.4 g/dL — ABNORMAL LOW (ref 3.5–5.0)
Alkaline Phosphatase: 93 U/L (ref 38–126)
Anion gap: 8 (ref 5–15)
BILIRUBIN TOTAL: 0.7 mg/dL (ref 0.3–1.2)
BUN: 31 mg/dL — AB (ref 6–20)
CO2: 23 mmol/L (ref 22–32)
CREATININE: 1.99 mg/dL — AB (ref 0.61–1.24)
Calcium: 8.8 mg/dL — ABNORMAL LOW (ref 8.9–10.3)
Chloride: 107 mmol/L (ref 101–111)
GFR calc Af Amer: 33 mL/min — ABNORMAL LOW (ref >60)
GFR, EST NON AFRICAN AMERICAN: 28 mL/min — AB (ref >60)
Glucose, Bld: 60 mg/dL — ABNORMAL LOW (ref 65–99)
Potassium: 3.8 mmol/L (ref 3.5–5.1)
Sodium: 138 mmol/L (ref 135–145)
TOTAL PROTEIN: 6.7 g/dL (ref 6.5–8.1)

## 2017-05-22 LAB — CBG MONITORING, ED: Glucose-Capillary: 108 mg/dL — ABNORMAL HIGH (ref 65–99)

## 2017-05-22 LAB — ABO/RH: ABO/RH(D): B POS

## 2017-05-22 LAB — TYPE AND SCREEN
ABO/RH(D): B POS
ANTIBODY SCREEN: NEGATIVE

## 2017-05-22 LAB — APTT: aPTT: 26 seconds (ref 24–36)

## 2017-05-22 LAB — POC OCCULT BLOOD, ED: Fecal Occult Bld: POSITIVE — AB

## 2017-05-22 MED ORDER — METOCLOPRAMIDE HCL 5 MG/ML IJ SOLN
10.0000 mg | Freq: Once | INTRAMUSCULAR | Status: AC
  Administered 2017-05-23: 10 mg via INTRAVENOUS
  Filled 2017-05-22: qty 2

## 2017-05-22 MED ORDER — BISACODYL 5 MG PO TBEC
5.0000 mg | DELAYED_RELEASE_TABLET | Freq: Once | ORAL | Status: DC
  Filled 2017-05-22: qty 1

## 2017-05-22 MED ORDER — ACETAMINOPHEN 650 MG RE SUPP: 650.0000 mg | Freq: Four times a day (QID) | RECTAL | Status: DC | PRN

## 2017-05-22 MED ORDER — POLYETHYLENE GLYCOL 3350 17 GM/SCOOP PO POWD
1.0000 | Freq: Once | ORAL | Status: AC
  Administered 2017-05-23: 255 g via ORAL
  Filled 2017-05-22 (×2): qty 255

## 2017-05-22 MED ORDER — METOCLOPRAMIDE HCL 5 MG/ML IJ SOLN: 10.0000 mg | Freq: Once | INTRAMUSCULAR | Status: DC

## 2017-05-22 MED ORDER — ACETAMINOPHEN 325 MG PO TABS
650.0000 mg | ORAL_TABLET | Freq: Four times a day (QID) | ORAL | Status: DC | PRN
  Filled 2017-05-22: qty 2

## 2017-05-22 NOTE — ED Notes (Signed)
Vitals re checked

## 2017-05-22 NOTE — ED Provider Notes (Signed)
Medical screening examination/treatment/procedure(s) were conducted as a shared visit with non-physician practitioner(s) and myself.  I personally evaluated the patient during the encounter.   EKG Interpretation None      Patient is an 83 year old male with history of hypertension, diabetes, blindness who presents to the emergency department from her nursing facility with rectal bleeding for the past 24 hours. Bleeding is bright red without clots or melena. No vomiting. No abdominal pain. No chest pain or shortness of breath.  Patient is elderly, with normal vital signs, benign abdominal exam. He does have large amount of gross red blood on rectal exam.  Hb 10.7.  Normal coags.  Pt is on ASA 81 mg.  Consulted GI.  Admitted to medicine.   Ward, Delice Bison, DO 05/22/17 1309

## 2017-05-22 NOTE — ED Triage Notes (Signed)
BIB [facility] from [facility]. Pt nurse stated to [facility] that pt has started 3 days ago of small spots of blood and tonight pt soaked through 2 depends of bright red blood. Pt does not c/o of any pain at this time.

## 2017-05-22 NOTE — ED Provider Notes (Signed)
[facility] DEPT Provider Note   CSN: [id number] Arrival date & time: 05/22/17  0737     History   Chief Complaint Chief Complaint  Patient presents with  . Rectal Bleeding    HPI MYKELL RAWL is a 83 y.o. male.  The history is provided by the patient and medical records. No language interpreter was used.  Rectal Bleeding  Associated symptoms: no abdominal pain and no vomiting    VALIANT DILLS is a 83 y.o. male  with a PMH of CKD, anemia, DM, blindness who presents to the Emergency Department complaining of rectal bleeding. Patient states that when he had a bowel movement yesterday morning, there was blood in his stool. He felt fine throughout the day yesterday with no further episodes of bleeding. This morning, patient awoke and had a bowel movement with more bright red blood in the stool and on depends. Per nurse at facility, bleeding started three days ago with just spotting, but today, soaked through two depends with bright red blood. Denies NSAID use or anticoagulants. Does have 81 mg ASA on med list. Patient with no complaints including abdominal pain, weakness, fatigue, nausea, vomiting, fever/chills. Patient states he feels fine, just can't stop bleeding. Per patient, never had colonoscopy or seen GI physician in the past. No hx of similar sxs.   Past Medical History:  Diagnosis Date  . Anemia, unspecified   . 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 Carcinoma in situ of prostate   . Chronic kidney disease, stage III (moderate)   . Diabetes mellitus   . Dyslipidemia   . Edema   . Legal blindness, as defined in [place]   . Obesity, unspecified   . Other and unspecified hyperlipidemia   . Type II or unspecified type diabetes mellitus with renal manifestations, not stated as uncontrolled(250.40)     Patient Active Problem List   Diagnosis Date Noted  . BRBPR (bright  red blood per rectum) 05/22/2017  . Protein-calorie malnutrition, severe ([place]) 08/06/2014  . Acute on chronic renal failure ([place]) 07/30/2014  . Acute encephalopathy 07/30/2014  . UTI (lower urinary tract infection) 07/30/2014  . Prostate cancer ([place]) 07/30/2014  . HTN (hypertension) 07/30/2014  . Dyslipidemia 07/30/2014  . Hyperglycemia 07/30/2014  . Anemia 07/30/2014    Past Surgical History:  Procedure Laterality Date  . CYSTOSCOPY/RETROGRADE/URETEROSCOPY/STONE EXTRACTION WITH BASKET Left 08/04/2014   Procedure: CYSTOSCOPY/RETROGRADE/URETEROSCOPY/STONE EXTRACTION WITH BASKET;  Surgeon: Raynelle Bring, MD;  Location: [facility];  Service: Urology;  Laterality: Left;  . NO PAST SURGERIES         Home Medications    Prior to Admission medications   Medication Sig Start Date End Date Taking? Authorizing Provider  aspirin EC 81 MG tablet Take 81 mg by mouth every other day.     [provider]  bicalutamide (CASODEX) 50 MG tablet Take 50 mg by mouth daily.    [provider]  bisacodyl (DULCOLAX) 5 MG EC tablet Take 1 tablet (5 mg total) by mouth daily as needed for moderate constipation. 08/05/14   Janece Canterbury, MD  ciprofloxacin (CIPRO) 500 MG tablet Take 1 tablet (500 mg total) by mouth 2 (two) times daily. 08/05/14   Janece Canterbury, MD  cyanocobalamin 500 MCG tablet Take 500 mcg by mouth daily.    [provider]  diltiazem (CARDIZEM CD) 120 MG 24 hr capsule  07/23/14   [provider]  feeding supplement, ENSURE COMPLETE, (ENSURE COMPLETE) LIQD Take 237 mLs by mouth daily. 08/08/14   Janece Canterbury, MD  feeding supplement, ENSURE, (ENSURE) PUDG Take 1 Container by mouth 2 (two) times daily between meals. 08/08/14   Janece Canterbury, MD  ferrous fumarate (FERRO-SEQUELS) 50 MG CR tablet Take 200 mg by mouth daily after breakfast.     [provider]  furosemide (LASIX) 20 MG tablet  07/23/14   [provider]  HUMULIN 70/30  (70-30) 100 UNIT/ML injection  08/08/14   [provider]  hydrALAZINE (APRESOLINE) 50 MG tablet  07/23/14   [provider]  mirtazapine (REMERON) 15 MG tablet Take 1 tablet (15 mg total) by mouth at bedtime. 08/08/14   Janece Canterbury, MD  Multiple Vitamin (MULTIVITAMIN WITH MINERALS) TABS Take 1 tablet by mouth daily.    [provider]  sennosides-docusate sodium (SENOKOT-S) 8.6-50 MG tablet Take 1 tablet by mouth daily.    [provider]  simvastatin (ZOCOR) 10 MG tablet  07/23/14   [provider]  simvastatin (ZOCOR) 20 MG tablet Take 20 mg by mouth at bedtime.    [provider]  Skin Protectants, Misc. (EUCERIN) cream Apply 1 application topically 2 (two) times daily. Apply to affected areas as directed.    [provider]    Family History History reviewed. No pertinent family history.  Social History Social History  Substance Use Topics  . Smoking status: Former Research scientist (life sciences)  . Smokeless tobacco: Never Used  . Alcohol use No     Allergies   Patient has no known allergies.   Review of Systems Review of Systems  Gastrointestinal: Positive for hematochezia. Negative for abdominal pain, constipation, diarrhea, nausea and vomiting.  All other systems reviewed and are negative.    Physical Exam Updated Vital Signs BP (!) 115/57   Pulse 72   Temp 98.3 F (36.8 C) (Oral)   Resp 20   SpO2 98%   Physical Exam  Constitutional: He is oriented to person, place, and time. He appears well-developed and well-nourished. No distress.  HENT:  Head: Normocephalic and atraumatic.  Cardiovascular: Normal rate, regular rhythm and normal heart sounds.   No murmur heard. Pulmonary/Chest: Effort normal and breath sounds normal. No respiratory distress.  Abdominal: Soft. He exhibits no distension. There is no tenderness.  Genitourinary:  Genitourinary Comments: Bright red blood mixed with stool. No external hemorrhoids or  fissures appreciated.  Musculoskeletal: He exhibits no edema.  Neurological: He is alert and oriented to person, place, and time.  Skin: Skin is warm and dry.  Nursing note and vitals reviewed.    ED Treatments / Results  Labs (all labs ordered are listed, but only abnormal results are displayed) Labs Reviewed  COMPREHENSIVE METABOLIC PANEL - Abnormal; Notable for the following:       Result Value   Glucose, Bld 60 (*)    BUN 31 (*)    Creatinine, Ser 1.99 (*)    Calcium 8.8 (*)    Albumin 3.4 (*)    ALT 14 (*)    GFR calc non Af Amer 28 (*)    GFR calc Af Amer 33 (*)    All other components within normal limits  CBC - Abnormal; Notable for the following:    RBC 4.17 (*)    Hemoglobin 10.7 (*)    HCT 33.9 (*)    MCH 25.7 (*)    All other components within normal limits  POC OCCULT BLOOD,  ED - Abnormal; Notable for the following:    Fecal Occult Bld POSITIVE (*)    All other components within normal limits  APTT  PROTIME-INR  TYPE AND SCREEN  ABO/RH    EKG  EKG Interpretation None       Radiology No results found.  Procedures Procedures (including critical care time)  Medications Ordered in ED Medications - No data to display   Initial Impression / Assessment and Plan / ED Course  I have reviewed the triage vital signs and the nursing notes.  Pertinent labs & imaging results that were available during my care of the patient were reviewed by me and considered in my medical decision making (see chart for details).    MONTRE HARBOR is a 83 y.o. male who presents to ED for rectal bleeding. Bright red blood noted on exam. Hemodynamically stable. Hemoccult positive. H&H 10.7/33.9.   9:10 AM - Spoke with GI, Azucena Freed. GI will see in consultation.   Hospitalist consulted who will admit.    Patient seen by and discussed with Dr. Leonides Schanz who agrees with treatment plan.   Final Clinical Impressions(s) / ED Diagnoses   Final diagnoses:  Rectal bleeding      New Prescriptions New Prescriptions   No medications on file     Layla Gramm, Ozella Almond, [place] 05/22/17 1610

## 2017-05-22 NOTE — ED Notes (Signed)
PT changed; call bell given; educated about falls; agreed not to get out of bed without calling.

## 2017-05-22 NOTE — ED Notes (Signed)
PA notified about CBG 60. Instructed to feed. [place] sandwich given.

## 2017-05-22 NOTE — ED Notes (Signed)
Pt transferred to hospital bed

## 2017-05-22 NOTE — H&P (Signed)
Date: 05/22/2017               Patient Name:  Luke Johnson MRN: [id number]  DOB: 11-03-1925 Age / Sex: 83 y.o., male   PCP: [name], MD         Medical Service: Internal Medicine Teaching Service         Attending Physician: Dr. Oval Linsey, MD    First Contact: Dr. Berneice Gandy Pager: [phone]  Second Contact: Dr. Charlynn Grimes Pager: [phone]       After Hours (After 5p/  First Contact Pager: [phone]  weekends / holidays): Second Contact Pager: [phone]   Chief Complaint: BRBPR  History of Present Illness:  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 History is obtained from patient and chart review, no ALF staff present. Per ED documentation, nursing staff at his facility noticed spotty bleeding beginning 3 days ago, but soaked through two depends today with bright red blood. Patient does not think staff saw dark black stool. He is unsure of his daily medications, however his medication list includes ASA 81 mg daily, no other blood thinners or anticoagulation. He denies any NSAID use.   He denies any prior history of Colonoscopy, however chart review shows he did have a Colonoscopy in March 2003 for guaiac-positive stool which revealed internal and external hemorrhoids. Several small polyps were biopsied which pathology was negative for high-grade dysplasia or malignancy. Chart review also reveals surgical pathology of polypectomies in 2011 and 2013 which were notable for multiple hyperplastic polyps without evidence of malignancy. These endoscopic reports are not available.  Patient denies any associated symptoms of lightheadedness, dizziness, abdominal pain, nausea, vomiting, fevers, chill, weakness, falls, trauma, or other obvious bleeding including epistaxis,  hemoptysis, hematemesis, or hematuria.  In the ED, initial vitals were BP 183/75 (repeat 153/62), Pulse 71, Temp 98.3 F, RR 18, SpO2 100% on RA. Lab work was notable for Hgb 10.7 (baseline ~9), MCV 81, Platelets 171, Creatinine 1.99 (baseline 1.9-2). FOBT was positive. CBG was 60 and patient was fed. GI were consulted and IMTS called for admission.  Meds:  No outpatient prescriptions have been marked as taking for the 05/22/17 encounter [facility] Encounter).     Allergies: Allergies as of 05/22/2017  . (No Known Allergies)   Past Medical History:  Diagnosis Date  . Anemia, unspecified   . 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 Carcinoma in situ of prostate   . Chronic kidney disease, stage III (moderate)   . Diabetes mellitus   . Dyslipidemia   . Edema   . Legal blindness, as defined in [place]   . Obesity, unspecified   . Other and unspecified hyperlipidemia   . Type II or unspecified type diabetes mellitus with renal manifestations, not stated as uncontrolled(250.40)     Family History:  Patient unaware of any family medical history.  Social History:  Social History   Social History  . Marital status: Single    Spouse name: N/A  . Number of children: N/A  . Years of education: N/A   Social History Main Topics  . Smoking status: Former Research scientist (life sciences)  . Smokeless tobacco: Never Used  . Alcohol  use No  . Drug use: No  . Sexual activity: No   Other Topics Concern  . None   Social History Narrative  . None     Review of Systems: A complete ROS was negative except as per HPI.   Physical Exam: Blood pressure (!) 115/57, pulse 72, temperature 98.3 F (36.8 C), temperature source Oral, resp. rate 20, SpO2 98 %. Physical Exam  Constitutional: He is oriented to person, place, and time. He appears well-nourished. No distress.  HENT:  Head: Normocephalic and atraumatic.    Mouth/Throat: Oropharynx is clear and moist. No oropharyngeal exudate.  Eyes: Conjunctivae are normal.  Severe glaucoma both eyes  Neck: Normal range of motion. Neck supple.  Cardiovascular: Normal rate, regular rhythm and intact distal pulses.   No murmur heard. Pulmonary/Chest: Effort normal. No respiratory distress. He has no wheezes. He has no rales.  Abdominal: Soft. Bowel sounds are normal. He exhibits no distension. There is no tenderness. There is no guarding.  Musculoskeletal: He exhibits no edema or tenderness.  Neurological: He is alert and oriented to person, place, and time.  Skin: Skin is warm. Capillary refill takes less than 2 seconds. He is not diaphoretic.  Psychiatric: He has a normal mood and affect.     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  BRBPR: Lower GI bleeding likely hemorrhoidal vs diverticular bleed. Surgical pathology from 2013 did show multiple hyperplastic polyps without evidence of malignancy, however cancer cannot be excluded. FOBT is positive in the ED. Patient is otherwise hemodynamically stable with a Hgb of 10.7 (prior baseline ~9). GI has been consulted. - GI recommendations appreciated - Hold ASA 81 mg for now - Monitor for signs/symptoms of bleeding - Repeat CBC in AM - NPO @ MN - SCDs - Avoid NSAIDs  HTN: BP elevated on arrival to ED at 183/75. This has improved to 115/57 without intervention. Medication list in EPIC lists Lasix, Hydralazine, and Diltiazem, however I am not sure if this is up to date. I  have requested an updated medication list from his ALF. - Hold BP meds, continue to monitor  T2DM: Last Hgb A1c 5.6 from 07/2014. His CBG in the ED was 60 which improved to 108 after being fed. He has Humulin listed in his medication list, awaiting updated records as above. - Hold insulin - Repeat A1c 6.2 - CBG checks TID AC  Prostate Cancer s/p Brachii Therapy: Last seen by Dr. Alen Blew in 2015 per chart review. Has been on Bicalutamide. He denies any dysuria or difficulty with urination. Awaiting updated med list.  CODE STATUS:  FULL CODE per patient.   Dispo: Admit patient to Observation with expected length of stay less than 2 midnights.  Signed: Zada Finders, MD 05/22/2017, 11:24 AM

## 2017-05-22 NOTE — ED Notes (Signed)
Renal diet tray delivered.

## 2017-05-22 NOTE — Consult Note (Signed)
Luke Johnson 83 y.o. Apr 16, 1926 [id number]  Assessment & Plan:   1. Rectal Bleeding 2 ? Malignancy 3. Anemia- Hem 10.7, HCT 33.9- possibly chronically anemic.   Most likely the bleeding is diverticular in origin.  ? Malignancy.   Follow CBC  Because he has never had a colonoscopy, we will consider doing one tomorrow to possibly identify source of bleeding.  I have personally seen the patient, reviewed and repeated key elements of the history and physical and participated in formation of the assessment and plan the student has documented.  Lower GI bleed - will evaluate with colonoscopy The risks and benefits as well as alternatives of endoscopic procedure(s) have been discussed and reviewed. All questions answered. The patient agrees to proceed.  Luke Mayer, MD, [facility] [phone] (pager) 05/22/2017 6:55 PM  Subjective:   Chief Complaint: Rectal Bleeding  HPI Luke Johnson is pleasant 83 yo african [place] male with past medical history significant for CKD, DM, anemia and blindness who was seen for consultation today for sudden onset rectal bleeding.  Patient reports the bleeding began a few days ago and has increased in quantity.  He lives at [facility] and the staff has seen bright red blood with bowel movements the past couple days and as of today has seen blood between bowel movements.   Patient denies change is bowel habits, no constipation or straining, no history of IBS, IBD.  Denies NSAID abuse.  He has no pain, fevers, chills weight loss.  No nausea, vomiting, no loss of appetite.  No history of inflamed hemorrhoids or anal fissure.  No Known Allergies No outpatient prescriptions have been marked as taking for the 05/22/17 encounter [facility] Encounter).   Past Medical History:  Diagnosis Date  . Anemia, unspecified   . Benign hypertensive heart and kidney disease without heart failure and with chronic kidney disease stage I  through stage IV, or unspecified   . Carcinoma in situ of prostate   . Chronic kidney disease, stage III (moderate)   . Diabetes mellitus   . Dyslipidemia   . Edema   . Legal blindness, as defined in [place]   . Obesity, unspecified   . Other and unspecified hyperlipidemia   . Type II or unspecified type diabetes mellitus with renal manifestations, not stated as uncontrolled(250.40)    Past Surgical History:  Procedure Laterality Date  . CYSTOSCOPY/RETROGRADE/URETEROSCOPY/STONE EXTRACTION WITH BASKET Left 08/04/2014   Procedure: CYSTOSCOPY/RETROGRADE/URETEROSCOPY/STONE EXTRACTION WITH BASKET;  Surgeon: Luke Bring, MD;  Location: [facility];  Service: Urology;  Laterality: Left;  . NO PAST SURGERIES     Social History   Social History  . Marital status: Single    Spouse name: N/A  . Number of children: N/A  . Years of education: N/A   Occupational History  . Not on file.   Social History Main Topics  . Smoking status: Former Research scientist (life sciences)  . Smokeless tobacco: Never Used  . Alcohol use No  . Drug use: No  . Sexual activity: No   Other Topics Concern  . Not on file   Social History Narrative  . No narrative on file   family history is not on file.   Review of Systems GI: positive for hematochezia, negative abdominal pain, bloating, N/V/D/C All other systems negative See HPI for details   Objective:   Physical Exam @BP  (!) 115/57   Pulse 72   Temp 98.3 F (36.8 C) (Oral)   Resp 20   SpO2  98% @  General:  Well-developed, well-nourished and in no acute distress Eyes:  Anicteric. ENT:   Mouth and posterior pharynx free of lesions.  Neck:   supple w/o thyromegaly or mass.  Lungs: Clear to auscultation bilaterally. Heart:  S1S2, no rubs, murmurs, gallops. Abdomen:  soft, non-tender, no hepatosplenomegaly, hernia, or mass and BS+.  Rectal: Deferred until colonoscopy Lymph:  no cervical or supraclavicular adenopathy. Extremities:   no edema, cyanosis or clubbing Skin    no rash. Neuro:  A&O x 3.  Psych:  appropriate mood and  Affect.   Data Reviewed: CBC Latest Ref Rng & Units 05/22/2017 08/06/2014 08/05/2014  WBC 4.0 - 10.5 K/uL 4.4 8.3 9.1  Hemoglobin 13.0 - 17.0 g/dL 10.7(L) 9.1(L) 8.8(L)  Hematocrit 39.0 - 52.0 % 33.9(L) 28.0(L) 27.1(L)  Platelets 150 - 400 K/uL 171 123(L) 79(L)   Luke Qualia, PA-S Luke Johnson

## 2017-05-22 NOTE — ED Notes (Signed)
Meal ordered

## 2017-05-23 ENCOUNTER — Encounter (HOSPITAL_COMMUNITY)
Admission: EM | Disposition: A | Discharge status: Skilled Nursing Facility | Payer: Self-pay | Source: Home / Self Care | Attending: Internal Medicine

## 2017-05-23 ENCOUNTER — Observation Stay (HOSPITAL_COMMUNITY): Payer: Medicare Other | Admitting: Anesthesiology

## 2017-05-23 ENCOUNTER — Encounter (HOSPITAL_COMMUNITY): Payer: Self-pay | Admitting: *Deleted

## 2017-05-23 DIAGNOSIS — K5730 Diverticulosis of large intestine without perforation or abscess without bleeding: Secondary | ICD-10-CM | POA: Diagnosis not present

## 2017-05-23 DIAGNOSIS — C20 Malignant neoplasm of rectum: Secondary | ICD-10-CM | POA: Diagnosis present

## 2017-05-23 DIAGNOSIS — Z7982 Long term (current) use of aspirin: Secondary | ICD-10-CM | POA: Diagnosis not present

## 2017-05-23 DIAGNOSIS — N183 Chronic kidney disease, stage 3 (moderate): Secondary | ICD-10-CM | POA: Diagnosis present

## 2017-05-23 DIAGNOSIS — D62 Acute posthemorrhagic anemia: Secondary | ICD-10-CM | POA: Diagnosis present

## 2017-05-23 DIAGNOSIS — Z8546 Personal history of malignant neoplasm of prostate: Secondary | ICD-10-CM

## 2017-05-23 DIAGNOSIS — H548 Legal blindness, as defined in USA: Secondary | ICD-10-CM | POA: Diagnosis present

## 2017-05-23 DIAGNOSIS — K625 Hemorrhage of anus and rectum: Secondary | ICD-10-CM | POA: Diagnosis present

## 2017-05-23 DIAGNOSIS — R64 Cachexia: Secondary | ICD-10-CM | POA: Diagnosis not present

## 2017-05-23 DIAGNOSIS — I1310 Hypertensive heart and chronic kidney disease without heart failure, with stage 1 through stage 4 chronic kidney disease, or unspecified chronic kidney disease: Secondary | ICD-10-CM | POA: Diagnosis present

## 2017-05-23 DIAGNOSIS — C61 Malignant neoplasm of prostate: Secondary | ICD-10-CM | POA: Diagnosis present

## 2017-05-23 DIAGNOSIS — E785 Hyperlipidemia, unspecified: Secondary | ICD-10-CM | POA: Diagnosis present

## 2017-05-23 DIAGNOSIS — E1122 Type 2 diabetes mellitus with diabetic chronic kidney disease: Secondary | ICD-10-CM | POA: Diagnosis present

## 2017-05-23 DIAGNOSIS — I129 Hypertensive chronic kidney disease with stage 1 through stage 4 chronic kidney disease, or unspecified chronic kidney disease: Secondary | ICD-10-CM

## 2017-05-23 DIAGNOSIS — Z923 Personal history of irradiation: Secondary | ICD-10-CM | POA: Diagnosis not present

## 2017-05-23 DIAGNOSIS — C775 Secondary and unspecified malignant neoplasm of intrapelvic lymph nodes: Secondary | ICD-10-CM

## 2017-05-23 DIAGNOSIS — K6289 Other specified diseases of anus and rectum: Secondary | ICD-10-CM

## 2017-05-23 DIAGNOSIS — Z87891 Personal history of nicotine dependence: Secondary | ICD-10-CM | POA: Diagnosis not present

## 2017-05-23 DIAGNOSIS — Z608 Other problems related to social environment: Secondary | ICD-10-CM | POA: Diagnosis not present

## 2017-05-23 DIAGNOSIS — H5440 Blindness, one eye, unspecified eye: Secondary | ICD-10-CM | POA: Diagnosis not present

## 2017-05-23 DIAGNOSIS — Z794 Long term (current) use of insulin: Secondary | ICD-10-CM | POA: Diagnosis not present

## 2017-05-23 DIAGNOSIS — E11649 Type 2 diabetes mellitus with hypoglycemia without coma: Secondary | ICD-10-CM | POA: Diagnosis present

## 2017-05-23 HISTORY — PX: COLONOSCOPY: SHX5424

## 2017-05-23 LAB — GLUCOSE, CAPILLARY
GLUCOSE-CAPILLARY: 160 mg/dL — AB (ref 65–99)
GLUCOSE-CAPILLARY: 117 mg/dL — AB (ref 65–99)
Glucose-Capillary: 142 mg/dL — ABNORMAL HIGH (ref 65–99)
Glucose-Capillary: 147 mg/dL — ABNORMAL HIGH (ref 65–99)

## 2017-05-23 LAB — CBC
HEMATOCRIT: 32.9 % — AB (ref 39.0–52.0)
HEMOGLOBIN: 10.5 g/dL — AB (ref 13.0–17.0)
MCH: 25.8 pg — ABNORMAL LOW (ref 26.0–34.0)
MCHC: 31.9 g/dL (ref 30.0–36.0)
MCV: 80.8 fL (ref 78.0–100.0)
Platelets: 187 10*3/uL (ref 150–400)
RBC: 4.07 MIL/uL — ABNORMAL LOW (ref 4.22–5.81)
RDW: 14.1 % (ref 11.5–15.5)
WBC: 5.2 10*3/uL (ref 4.0–10.5)

## 2017-05-23 LAB — BASIC METABOLIC PANEL
ANION GAP: 11 (ref 5–15)
BUN: 30 mg/dL — AB (ref 6–20)
CHLORIDE: 107 mmol/L (ref 101–111)
CO2: 21 mmol/L — ABNORMAL LOW (ref 22–32)
Calcium: 8.4 mg/dL — ABNORMAL LOW (ref 8.9–10.3)
Creatinine, Ser: 1.99 mg/dL — ABNORMAL HIGH (ref 0.61–1.24)
GFR calc Af Amer: 33 mL/min — ABNORMAL LOW (ref >60)
GFR, EST NON AFRICAN AMERICAN: 28 mL/min — AB (ref >60)
GLUCOSE: 130 mg/dL — AB (ref 65–99)
POTASSIUM: 4.6 mmol/L (ref 3.5–5.1)
Sodium: 139 mmol/L (ref 135–145)

## 2017-05-23 LAB — MRSA PCR SCREENING: MRSA by PCR: NEGATIVE

## 2017-05-23 SURGERY — COLONOSCOPY
Anesthesia: Monitor Anesthesia Care

## 2017-05-23 MED ORDER — PNEUMOCOCCAL VAC POLYVALENT 25 MCG/0.5ML IJ INJ: 0.5000 mL | INJECTION | INTRAMUSCULAR | Status: DC | PRN

## 2017-05-23 MED ORDER — FENTANYL CITRATE (PF) 100 MCG/2ML IJ SOLN
INTRAMUSCULAR | Status: DC | PRN
  Administered 2017-05-23: 25 ug via INTRAVENOUS
  Administered 2017-05-23 (×2): 12.5 ug via INTRAVENOUS

## 2017-05-23 MED ORDER — MIDAZOLAM HCL 5 MG/ML IJ SOLN
INTRAMUSCULAR | Status: AC
  Filled 2017-05-23: qty 2

## 2017-05-23 MED ORDER — FENTANYL CITRATE (PF) 100 MCG/2ML IJ SOLN
INTRAMUSCULAR | Status: AC
  Filled 2017-05-23: qty 2

## 2017-05-23 MED ORDER — MIDAZOLAM HCL 5 MG/5ML IJ SOLN
INTRAMUSCULAR | Status: DC | PRN
  Administered 2017-05-23 (×3): 1 mg via INTRAVENOUS

## 2017-05-23 MED ORDER — SODIUM CHLORIDE 0.9 % IV SOLN
INTRAVENOUS | Status: AC | PRN
  Administered 2017-05-23: 500 mL via INTRAVENOUS

## 2017-05-23 NOTE — Progress Notes (Signed)
   Subjective:  Mr. Rosenberg was seen laying in bed this AM. He had no complaints but was having many bowel movements as a result of his colonoscopy prep that he started. The patient's nurse aid bedside has not reported any blood in his stool.  Patient denies chest pain, shortness of breath, abdominal pain, diaphoresis, nausea/vomiting, lower extremity swelling, and change in bowel/bladder habits.  Objective:  Vital signs in last 24 hours: Vitals:   05/23/17 1610 05/23/17 1615 05/23/17 1620 05/23/17 1625  BP:   (!) 116/50   Pulse: 63  61 62  Resp: (!) 22 (!) 22  (!) 21  Temp:    97.9 F (36.6 C)  TempSrc:    Oral  SpO2: 100%  97% 98%  Weight:      Height:       Physical Exam  Constitutional: No distress.  He was laying comfortably in bed wearing sunglasses in no acute distress.  Cardiovascular: Normal rate, regular rhythm, normal heart sounds and intact distal pulses.  Exam reveals no friction rub.   No murmur heard. Pulmonary/Chest: Effort normal and breath sounds normal. No respiratory distress. He has no wheezes.  Abdominal: Soft. He exhibits no distension. There is no tenderness. There is no guarding.  Genitourinary:  Genitourinary Comments: Rectal exam deferred pending colonoscopy.  Musculoskeletal: He exhibits no edema (of lower extremities) or tenderness (of bilateral lower extremities).  Skin: Skin is warm and dry. No erythema. No pallor.   Assessment/Plan:  Principal Problem:   BRBPR (bright red blood per rectum) Active Problems:   Prostate cancer (HCC)   HTN (hypertension)   Type 2 diabetes mellitus ([place])   Rectal cancer (HCC)  BRBPR: Patient presented without any complaints of abdominal pain or pain with bowel movements. Caregivers at his facility noticed drops of blood on his diaper which progressed over the next few days to blood soaking through his depends. He remained asymptomatic at that time. The differential includes bleeding hemorrhoid, a diverticular  bleed, malignancy, or radiation proctitis, all of which could cause painless bleeding. His AM CBC showed h/h of 10.5/32.9 which is stable from admission. He is asymptomatic and does not require a transfusion at this time. -Colonoscopy pending 8/9 differential includes hemorrhoids, diverticular bleed, vs. malignancy -Restart home ASA 81 mg after procedure  HTN: BP elevated in ED on admission. Stable since arrival to floor. Will continue to monitor  Hx of T2DM: Last A1C from 2016 = 5.6 with repeat A1C on admission 6.2%. Likely no need for insulin at this time but will continue to monitor.  VTE prophylaxis: -SCDs when in bed -Consider lovenox after procedure  Diet:  -Advance diet as tolerated after his procedure  Dispo: Anticipated discharge in approximately 1-2 day(s).   Thomasene Ripple, MD 05/23/2017, 4:29 PM Pager: [phone]

## 2017-05-23 NOTE — Progress Notes (Signed)
Patient seen today, resting in bed.  Per patient and tech in the room he has been drinking his colonoscopy prep and has had multiple BMs.  Patient reports no pain, says he feels fine.  I told him to finish his prep and we would see him for his colonoscopy later today.  Luke Johnson, [place]

## 2017-05-23 NOTE — Op Note (Signed)
[facility] Patient Name: Luke Johnson Procedure Date : 05/23/2017 MRN: [id number] Attending MD: Gatha Mayer , MD Date of Birth: Sep 17, 1926 CSN: [id number] Age: 83 Admit Type: Inpatient Procedure:                Colonoscopy Indications:              Hematochezia Providers:                Gatha Mayer, MD, Cleda Daub, RN, Corliss Parish, Technician Referring MD:              Medicines:                Fentanyl 50 micrograms IV, Midazolam 3 mg IV Complications:            No immediate complications. Estimated Blood Loss:     Estimated blood loss was minimal. Procedure:                Pre-Anesthesia Assessment:                           - Prior to the procedure, a History and Physical                            was performed, and patient medications and                            allergies were reviewed. The patient's tolerance of                            previous anesthesia was also reviewed. The risks                            and benefits of the procedure and the sedation                            options and risks were discussed with the patient.                            All questions were answered, and informed consent                            was obtained. Prior Anticoagulants: The patient has                            taken no previous anticoagulant or antiplatelet                            agents. ASA Grade Assessment: III - A patient with                            severe systemic disease. After reviewing the risks  and benefits, the patient was deemed in                            satisfactory condition to undergo the procedure.                           After obtaining informed consent, the colonoscope                            was passed under direct vision. Throughout the                            procedure, the patient's blood pressure, pulse, and                            oxygen  saturations were monitored continuously. The                            Colonoscope was introduced through the anus and                            advanced to the the cecum, identified by                            appendiceal orifice and ileocecal valve. The                            colonoscopy was performed without difficulty. The                            patient tolerated the procedure well. The quality                            of the bowel preparation was adequate. The                            ileocecal valve, appendiceal orifice, and rectum                            were photographed. Scope In: 3:49:43 PM Scope Out: 4:06:58 PM Scope Withdrawal Time: 0 hours 7 minutes 15 seconds  Total Procedure Duration: 0 hours 17 minutes 15 seconds  Findings:      The perianal examination was normal.      The digital rectal exam revealed a rubbery-textured, mobile and nodular       rectal mass palpated 0 to 1 cm from the anal verge. The mass was       non-circumferential and located predominantly at the right bowel wall.      An ulcerated non-obstructing medium-sized mass was found in the distal       rectum. The mass was non-circumferential. Oozing was present. This was       biopsied with a cold forceps for histology. Verification of patient       identification for the specimen was done. Estimated blood loss was       minimal.  A few diverticula were found in the sigmoid colon.      The exam was otherwise without abnormality on direct and retroflexion       views. Impression:               - Rectal mass 0 to 1 cm from the anal verge.                           - Malignant tumor in the distal rectum. Biopsied.                           - Diverticulosis in the sigmoid colon.                           - The examination was otherwise normal on direct                            and retroflexion views. Moderate Sedation:      Moderate (conscious) sedation was administered by the  endoscopy nurse       and supervised by the endoscopist. The following parameters were       monitored: oxygen saturation, heart rate, blood pressure, respiratory       rate, EKG, adequacy of pulmonary ventilation, and response to care.       Total physician intraservice time was 10 minutes. Recommendation:           - Patient has a contact number available for                            emergencies. The signs and symptoms of potential                            delayed complications were discussed with the                            patient. Return to normal activities tomorrow.                            Written discharge instructions were provided to the                            patient.                           - Return patient to hospital ward for ongoing care.                           - Resume regular diet.                           - Continue present medications.                           - Await pathology results.                           - CT Chest/abd/pelvis next - orderd  for AM                           await path                           - No repeat colonoscopy due to age. Procedure Code(s):        --- Professional ---                           [phone], Colonoscopy, flexible; with biopsy, single                            or multiple Diagnosis Code(s):        --- Professional ---                           K62.89, Other specified diseases of anus and rectum                           C20, Malignant neoplasm of rectum                           K92.1, Melena (includes Hematochezia)                           K57.30, Diverticulosis of large intestine without                            perforation or abscess without bleeding CPT copyright 2016 American Medical Association. All rights reserved. The codes documented in this report are preliminary and upon coder review may  be revised to meet current compliance requirements. Gatha Mayer, MD 05/23/2017 4:26:32 PM This report has  been signed electronically. Number of Addenda: 0

## 2017-05-23 NOTE — Care Management Obs Status (Signed)
[place] NOTIFICATION   Patient Details  Name: Luke Johnson MRN: [id number] Date of Birth: December 26, 1925   Medicare Observation Status Notification Given:  Yes    Marilu Favre, RN 05/23/2017, 11:10 AM

## 2017-05-23 NOTE — Progress Notes (Signed)
Saw patient and evaluated in preop. Afterwards, decision made to perform procedure under moderate sedation without anesthesia services.

## 2017-05-23 NOTE — H&P (Signed)
Internal Medicine Attending Admission Note Date: 05/23/2017  Patient name: Luke Johnson Medical record number: [id number] Date of birth: Oct 06, 1926 Age: 83 y.o. Gender: male  I saw and evaluated the patient. I reviewed the resident's note and I agree with the resident's findings and plan as documented in the resident's note.  Chief Complaint(s): Bright red blood per rectum 3 days.  History - key components related to admission:  Luke Johnson is an 83 year old man with a history of prostate cancer, diabetes, hypertension, stage III chronic kidney disease, and glaucoma complicated by blindness who lives in an assisted living facility and who presents with a three-day history of bright red blood per rectum. He was in his usual state of health until 3 days prior to admission when the nursing staff at the assisted living facility noted spotting of his depends. On the morning of admission he had soaked through 2 depends with bright red blood per rectum. He was brought to the emergency department for further evaluation. He denies any abdominal pain, nausea, vomiting, fevers, chills, lightheadedness, or recent trauma. He is unsure of his medications but the medication list we received has no anticoagulants or nonsteroidals. He is on aspirin 81 mg daily. The patient denies a previous history of colonoscopy although there is a reported colonoscopy in 2003 and subsequent polypectomy pathology reports in 2011 and 2013 (hyperplastic). He was admitted to the internal medicine teaching service for further evaluation and care.  When seen on rounds the morning following admission he was feeling well without any complaints. He specifically denied lightheadedness or abdominal pain. He subsequently underwent colonoscopy which demonstrated a rectal mass 1 cm beyond the anal verge.  Physical Exam - key components related to admission:  Vitals:   05/23/17 1640 05/23/17 1645 05/23/17 1657 05/23/17 2047  BP: (!) 132/48   (!) 124/55 126/60  Pulse: (!) 54 (!) 56 60 60  Resp: (!) 22 19 20 20   Temp:   98 F (36.7 C) 98.2 F (36.8 C)  TempSrc:   Oral Oral  SpO2: 94% 98% 99% 99%  Weight:      Height:       Gen.: Well-developed, well-nourished, man lying comfortably in bed in no acute distress. Abdomen: Soft, nontender, without guarding or rebound.  Lab results:  Basic Metabolic Panel:  Recent Labs  05/22/17 0744 05/23/17 0356  NA 138 139  K 3.8 4.6  CL 107 107  CO2 23 21*  GLUCOSE 60* 130*  BUN 31* 30*  CREATININE 1.99* 1.99*  CALCIUM 8.8* 8.4*   Liver Function Tests:  Recent Labs  05/22/17 0744  AST 25  ALT 14*  ALKPHOS 93  BILITOT 0.7  PROT 6.7  ALBUMIN 3.4*   CBC:  Recent Labs  05/22/17 0744 05/23/17 0356  WBC 4.4 5.2  HGB 10.7* 10.5*  HCT 33.9* 32.9*  MCV 81.3 80.8  PLT 171 187   CBG:  Recent Labs  05/22/17 1011 05/23/17 0842 05/23/17 1246 05/23/17 1702 05/23/17 2050  GLUCAP 108* 160* 117* 142* 147*   Hemoglobin A1C:  Recent Labs  05/22/17 0744  HGBA1C 6.2*   Coagulation:  Recent Labs  05/22/17 0946  INR 1.07   Misc. Labs:  Fecal occult blood test positive CEA pending  Assessment & Plan by Problem:  [name] is an 83 year old man with a history of prostate cancer, diabetes, hypertension, stage III chronic kidney disease, and glaucoma complicated by blindness who lives in an assisted living facility and who presents with a three-day  history of bright red blood per rectum. Colonoscopy revealed a rectal mass that is the likely source of the bright red blood per rectum. This is concerning for a distal rectal adenocarcinoma although the final pathology on the biopsy is pending.  Further radiographic staging has been ordered. Once we have a more definitive diagnosis and a better idea of the extent of his disease we will be able to inform him of his options for therapy should he wanted any.  1) Rectal mass: Concerning for adenocarcinoma of the rectum.  We will follow-up on the results of the colonoscopic biopsy. A CT of the chest, abdomen, and pelvis have been ordered and will likely be completed tomorrow morning. Further evaluation and discussion of options are pending the results of this evaluation.

## 2017-05-23 NOTE — Anesthesia Preprocedure Evaluation (Deleted)
Anesthesia Evaluation  Patient identified by MRN, date of birth, ID band Patient awake    Reviewed: Allergy & Precautions, NPO status , Patient's Chart, lab work & pertinent test results  Airway Mallampati: II  TM Distance: >3 FB Neck ROM: Full    Dental no notable dental hx. (+) Dental Advisory Given   Pulmonary former smoker,    Pulmonary exam normal breath sounds clear to auscultation       Cardiovascular hypertension, Normal cardiovascular exam Rhythm:Regular Rate:Normal     Neuro/Psych Legally blind negative psych ROS   GI/Hepatic negative GI ROS, Neg liver ROS,   Endo/Other  diabetes, Type 2  Renal/GU CRFRenal disease  negative genitourinary   Musculoskeletal negative musculoskeletal ROS (+)   Abdominal   Peds negative pediatric ROS (+)  Hematology  (+) anemia ,   Anesthesia Other Findings Prostate CA  Reproductive/Obstetrics negative OB ROS                             Anesthesia Physical Anesthesia Plan  ASA: III  Anesthesia Plan: MAC   Post-op Pain Management:    Induction: Intravenous  PONV Risk Score and Plan: 1 and Treatment may vary due to age or medical condition  Airway Management Planned: Nasal Cannula  Additional Equipment:   Intra-op Plan:   Post-operative Plan:   Informed Consent: I have reviewed the patients History and Physical, chart, labs and discussed the procedure including the risks, benefits and alternatives for the proposed anesthesia with the patient or authorized representative who has indicated his/her understanding and acceptance.   Dental advisory given  Plan Discussed with: CRNA  Anesthesia Plan Comments:        Anesthesia Quick Evaluation

## 2017-05-23 NOTE — Progress Notes (Signed)
Internal Medicine Attending  Date: 05/23/2017  Patient name: Luke Johnson Medical record number: [id number] Date of birth: 13-Dec-1925 Age: 83 y.o. Gender: male  I saw and evaluated the patient. I reviewed the resident's note by Dr. Berneice Gandy and I agree with the resident's findings and plans as documented in her progress note.  Please see my H&P dated 06/02/2017 for the specifics of my evaluation, assessment, and plan from earlier in the day.

## 2017-05-23 NOTE — ED Notes (Signed)
Attempted to call report.  No answer on unit. 

## 2017-05-24 ENCOUNTER — Inpatient Hospital Stay (HOSPITAL_COMMUNITY): Payer: Medicare Other

## 2017-05-24 DIAGNOSIS — K625 Hemorrhage of anus and rectum: Secondary | ICD-10-CM

## 2017-05-24 LAB — GLUCOSE, CAPILLARY
GLUCOSE-CAPILLARY: 148 mg/dL — AB (ref 65–99)
Glucose-Capillary: 116 mg/dL — ABNORMAL HIGH (ref 65–99)

## 2017-05-24 MED ORDER — ATORVASTATIN CALCIUM 20 MG PO TABS
20.0000 mg | ORAL_TABLET | Freq: Every day | ORAL | Status: DC
  Administered 2017-05-24 – 2017-05-27 (×4): 20 mg via ORAL
  Filled 2017-05-24 (×4): qty 1

## 2017-05-24 MED ORDER — BICALUTAMIDE 50 MG PO TABS
50.0000 mg | ORAL_TABLET | Freq: Every day | ORAL | Status: DC
  Administered 2017-05-24 – 2017-05-28 (×5): 50 mg via ORAL
  Filled 2017-05-24 (×5): qty 1

## 2017-05-24 MED ORDER — SENNOSIDES-DOCUSATE SODIUM 8.6-50 MG PO TABS
1.0000 | ORAL_TABLET | Freq: Every day | ORAL | Status: DC
  Administered 2017-05-24 – 2017-05-27 (×4): 1 via ORAL
  Filled 2017-05-24 (×5): qty 1

## 2017-05-24 MED ORDER — MIRTAZAPINE 15 MG PO TABS
15.0000 mg | ORAL_TABLET | Freq: Every day | ORAL | Status: DC
  Administered 2017-05-24 – 2017-05-27 (×4): 15 mg via ORAL
  Filled 2017-05-24 (×5): qty 1

## 2017-05-24 MED ORDER — VITAMIN B-12 1000 MCG PO TABS
500.0000 ug | ORAL_TABLET | Freq: Every day | ORAL | Status: DC
  Administered 2017-05-24 – 2017-05-28 (×5): 500 ug via ORAL
  Filled 2017-05-24 (×5): qty 1

## 2017-05-24 MED ORDER — ADULT MULTIVITAMIN W/MINERALS CH
1.0000 | ORAL_TABLET | Freq: Every day | ORAL | Status: DC
  Administered 2017-05-24 – 2017-05-28 (×5): 1 via ORAL
  Filled 2017-05-24 (×5): qty 1

## 2017-05-24 NOTE — Progress Notes (Signed)
Internal Medicine Attending  Date: 05/24/2017  Patient name: Luke Johnson Medical record number: [id number] Date of birth: January 07, 1926 Age: 83 y.o. Gender: male  I saw and evaluated the patient. I reviewed the resident's note by Dr. Berneice Gandy and I agree with the resident's findings and plans as documented in her progress note.  When seen on rounds this morning [name] was without complaints. His abdominal exam was soft and nontender without guarding or rebound. He was informed that a mass was found and that further evaluation is necessary. He is currently getting a CT of the chest, abdomen, and pelvis. The cytology from the rectal masses pending. Once we have the results of the CT scan and/or the cytology we will be able to better inform [name] as to what he has and what his options are.

## 2017-05-24 NOTE — Discharge Summary (Signed)
Name: Luke Johnson MRN: [id number] DOB: 1926-08-28 83 y.o. PCP: [name], MD  Date of Admission: 05/22/2017  6:26 AM Date of Discharge: 05/27/2017 Attending Physician: Oval Linsey, MD  Discharge Diagnosis:  Principal Problem:   BRBPR (bright red blood per rectum) Active Problems:   Prostate cancer ([place])   HTN (hypertension)   Type 2 diabetes mellitus ([facility])   Rectal cancer ([place])   Rectal mass   Rectal bleeding   Chronic renal insufficiency, stage III (moderate)  Discharge Medications: Allergies as of 05/27/2017   No Known Allergies     Medication List    STOP taking these medications   ciprofloxacin 500 MG tablet Commonly known as:  CIPRO     TAKE these medications   aspirin EC 81 MG tablet Take 81 mg by mouth every other day.   atorvastatin 20 MG tablet Commonly known as:  LIPITOR Take 20 mg by mouth at bedtime.   bicalutamide 50 MG tablet Commonly known as:  CASODEX Take 50 mg by mouth daily.   cyanocobalamin 500 MCG tablet Take 500 mcg by mouth daily.   D3-1000 1000 units capsule Generic drug:  Cholecalciferol Take 1,000 Units by mouth daily.   eucerin cream Apply 1 application topically 2 (two) times daily. Apply to affected areas as directed.   feeding supplement (ENSURE COMPLETE) Liqd Take 237 mLs by mouth daily. What changed:  Another medication with the same name was removed. Continue taking this medication, and follow the directions you see here.   FERRO-SEQUELS 50 MG CR tablet Generic drug:  ferrous fumarate Take 50 mg by mouth daily after breakfast.   HUMULIN 70/30 (70-30) 100 UNIT/ML injection Generic drug:  insulin NPH-regular Human Inject 10 Units into the skin daily after breakfast.   mirtazapine 15 MG tablet Commonly known as:  REMERON Take 1 tablet (15 mg total) by mouth at bedtime. What changed:  when to take this   multivitamin with minerals Tabs tablet Take 1 tablet by mouth daily.   sennosides-docusate sodium  8.6-50 MG tablet Commonly known as:  SENOKOT-S Take 1 tablet by mouth daily.      Disposition and follow-up:   Luke Johnson was discharged from [facility] in Good condition.  At the hospital follow up visit please address:  1.  Luke Johnson has a history of prostate cancer (s/p presented with a painless lower GI bleed which was determined to be secondary to an invasive adenocarcinoma. His staging scans revealed presacral and perirectal nodes concerning for metastasis. Consult with radiation oncology suggested low-dose palliative radiation to prevent obstruction and further bleeding.  2.  Labs / imaging needed at time of follow-up: None  3.  Pending labs/ test needing follow-up: Patient had several tiny, right pulmonary nodules that were too small to classify and small architectural distortion in RUL that could be reassessed with follow up imaging. Patient also had testosterone levels, free and total drawn during hospitalization that were pending on discharge. Please follow up.  Follow-up Appointments: Dr. Tyler Pita, Radiation Oncology  [place]  2:30 PM on 06/05/2017  Hospital Course by problem list: Principal Problem:   BRBPR (bright red blood per rectum) Active Problems:   Prostate cancer (HCC)   HTN (hypertension)   Type 2 diabetes mellitus (HCC)   Rectal cancer (HCC)   Rectal mass   Rectal bleeding   Chronic renal insufficiency, stage III (moderate)   Luke Johnson is an 83 yo with a PMH of prostate  cancer s/p brachytherapy with biochemical recurrence in 2015, stage 3 CKD, glaucoma, and T2DM who presented to the ED after caregivers noticed bright red blood per rectum. The patient remained asymptomatic and denied abdominal pain or pain with bowel movements. He was admitted to the internal medicine teaching service with consultations from gastroenterology during his hospitalization. The problems addressed during his admission are as  follows:   Bright red blood per rectum: Patient presented painless lower GI bleeding, which was initially thought to be diverticular or hemorrhoidal. Colonoscopy revealed a mass in the distal rectum that was concerning for malignancy. This mass was biopsied and surgical pathology revealed an invasive adenocarcinoma that was likely represented a new, primary rectal adenocarcinoma by histology (per conversation with reviewing pathologist). The patient had further imaging, which including a CT of the chest, abdomen, and pelvis CT on HD#2. These scans showed no evidence of definitive distant metastasis to the lungs, but several perirectal and presacral nodes concerning for metastasis. A radiation oncology consult was obtained while inpatient, which suggested that the patient undergo low dose palliative radiation to help with possible rectal bleeding and prevent possible obstruction from this mass in the future. The patient will follow up with radiation oncology as an outpatient.   Hx of prostate cancer with biochemical recurrence in 2015: There was initial concern that the patient had radiation proctitis given his presentation and treatment of prostate cancer, however this was ruled out with colonoscopy and his home bicalutamide was continued. His previous PSA was 4.46 in 2015 and was found to be 14.93 during hospitalization. Testosterone levels were drawn during hospitalization but were pending at time of discharge.   Hx of hypertension with stage 3 CKD: The patient does not take medications for this at home and remained normotensive throughout his stay. Per chart review his Cr of 1.99 on arrival was at historical baseline.   Discharge Vitals:   BP (!) 132/52 (BP Location: Left Arm)   Pulse 65   Temp 98.7 F (37.1 C) (Oral)   Resp (!) 189   Ht 5\' 8"  (1.727 m)   Wt 162 lb (73.5 kg)   SpO2 99%   BMI 24.63 kg/m   Pertinent Labs, Studies, and Procedures:   BMP BMP Latest Ref Rng & Units 05/23/2017  05/22/2017 08/06/2014  Glucose 65 - 99 mg/dL 130(H) 60(L) 188(H)  BUN 6 - 20 mg/dL 30(H) 31(H) 40(H)  Creatinine 0.61 - 1.24 mg/dL 1.99(H) 1.99(H) 1.87(H)  Sodium 135 - 145 mmol/L 139 138 138  Potassium 3.5 - 5.1 mmol/L 4.6 3.8 3.4(L)  Chloride 101 - 111 mmol/L 107 107 98  CO2 22 - 32 mmol/L 21(L) 23 30  Calcium 8.9 - 10.3 mg/dL 8.4(L) 8.8(L) 7.6(L)   CBC CBC Latest Ref Rng & Units 05/23/2017 05/22/2017 08/06/2014  WBC 4.0 - 10.5 K/uL 5.2 4.4 8.3  Hemoglobin 13.0 - 17.0 g/dL 10.5(L) 10.7(L) 9.1(L)  Hematocrit 39.0 - 52.0 % 32.9(L) 33.9(L) 28.0(L)  Platelets 150 - 400 K/uL 187 171 123(L)   Colonoscopy Findings on 05/23/2017: The digital rectal exam revealed a rubbery-textured, mobile and nodular rectal mass palpated 0 to 1 cm from the anal verge. The mass was non-circumferential and located predominantly at the right bowel wall.  An ulcerated non-obstructing medium-sized mass was found in the distal rectum. The mass was noncircumferential. Oozing was present. This was biopsied with a cold forceps for histology, which was later returned to be invasive adenocarcinoma consistent with rectal primary cancer (surgical pathology report on 05/23/2017, results discussed  with pathologist prior to discharge).  Impression of CT chest, abdomen, and pelvis on 05/24/2017: IMPRESSION: 1. Wall irregularity along the anterior rectum presumably represents the patient's known primary malignancy. 2. Small perirectal and presacral lymph nodes concerning for metastatic disease. 3. Several tiny right pulmonary nodules, too small to characterize. Metastatic disease not excluded and close attention on follow-up recommended. Small focus of architectural distortion in the right upper lobe could also be reassessed at the time of followup imaging. 4. Tiny low-density liver lesions seen on prior imaging studies and likely indicative of cysts. No definite metastatic disease to the liver although lack of intravenous contrast  lessens sensitivity for detection. 5. Large hiatal hernia contains nearly the entire stomach. 6. Right groin hernia contains a knuckle of the anterior bladder wall. 7. Heterogeneous bony mineralization without definite metastatic disease in the osseous anatomy. 8.  Aortic Atherosclerois (ICD10-170.0)  Electronically Signed   By: Misty Stanley M.D.   On: 05/24/2017 16:21   Discharge Instructions: Please follow up with Radiation Oncology. I made an appointment for you with Dr. Tyler Pita, the oncologist you spoke with in the hospital, for Wednesday 06/06/2107 at 2:30pm at the [facility] for follow up.  SignedThomasene Ripple, MD 05/27/2017, 3:55 PM   Pager: [phone]

## 2017-05-24 NOTE — Progress Notes (Signed)
   Subjective:  Luke Johnson was seen laying comfortably in bed this morning. He had no acute complaints. He denied any abdominal pain and any additional bowel movements since yesterday. He had questions about the results of his test. He was informed that the GI doctors saw a mass concerning for malignancy on colonoscopy and that he would need to undergo more testing today. The patient was amenable to further testing.   Objective:  Vital signs in last 24 hours: Vitals:   05/23/17 1645 05/23/17 1657 05/23/17 2047 05/24/17 0603  BP:  (!) 124/55 126/60 125/63  Pulse: (!) 56 60 60 60  Resp: 19 20 20 20   Temp:  98 F (36.7 C) 98.2 F (36.8 C) 98.1 F (36.7 C)  TempSrc:  Oral Oral Oral  SpO2: 98% 99% 99% 99%  Weight:      Height:       Physical Exam  Constitutional: He appears well-developed and well-nourished. No distress.  Cardiovascular: Normal rate, regular rhythm and normal heart sounds.   No murmur heard. Pulmonary/Chest: Effort normal and breath sounds normal. No respiratory distress.  Abdominal: Soft. He exhibits no distension. There is no tenderness.  Musculoskeletal: He exhibits no edema (of bilateral lower extremities) or tenderness (of bilateral lower extremities).  Skin: Skin is warm and dry. Capillary refill takes less than 2 seconds. No rash noted. No erythema.   Assessment/Plan:  Principal Problem:   BRBPR (bright red blood per rectum) Active Problems:   Prostate cancer (HCC)   HTN (hypertension)   Type 2 diabetes mellitus (HCC)   Rectal cancer (HCC)   Rectal mass  Luke Johnson is an 83 yo with a PMH of prostate cancer, glaucoma, and T2DM who presented to the ED after caregivers in his assisted living facility noticed bright red blood soaking through his depends prior to admission. The patient remained asymptomatic, stating that he was not having abdominal pain or pain with bowel movements. He was admitted to the internal medicine teaching service with consultations  from gastroenterology during his hospitalization. The problems addressed during his admission are as follows:   BRBPR: Patient presented painless lower GI bleeding, which was initially thought to be diverticular or hemorrhoidal. Colonoscopy revealed a mass in the distal rectum that was concerning for malignancy that was biopsied. He remains asymptomatic. Staging scans, including a chest, abdomen, and pelvic CT have been ordered. His discharge is pending further workup and staging for his presumed rectal cancer.  -Colonoscopy surgical pathology pending -CEA pending -Patient will likely require consult to med/onc as outpatient -Continue home senna-docusate  HTN: BP elevated in ED on admission. Stable since arrival to floor. Will continue to monitor.   Hx of prostate cancer: There was initial concern that the patient had radiation proctitis given his presentation and treatment of prostate cancer, however this was ruled out with colonoscopy.  -Continue home bicalutamide  Hx of T2DM: Last A1C from 2016 = 5.6 with repeat A1C on admission 6.2%. Patient was hypoglycemic on presentation to ED which responded to  -CBG TID before meals with values between 140-160 throughout his stay.   VTE prophylaxis: -SCDs when in bed -Ambulation when possible  Diet:  -Advance diet as tolerated -Continue home vitamin B-12  Dispo: Anticipated discharge in approximately 1-2 day.   Thomasene Ripple, MD 05/24/2017, 12:27 PM Pager: [phone]

## 2017-05-25 DIAGNOSIS — H5440 Blindness, one eye, unspecified eye: Secondary | ICD-10-CM

## 2017-05-25 DIAGNOSIS — Z608 Other problems related to social environment: Secondary | ICD-10-CM

## 2017-05-25 DIAGNOSIS — N2889 Other specified disorders of kidney and ureter: Secondary | ICD-10-CM

## 2017-05-25 DIAGNOSIS — N183 Chronic kidney disease, stage 3 unspecified: Secondary | ICD-10-CM

## 2017-05-25 DIAGNOSIS — R64 Cachexia: Secondary | ICD-10-CM

## 2017-05-25 LAB — CEA: CEA1: 4.6 ng/mL (ref 0.0–4.7)

## 2017-05-25 LAB — GLUCOSE, CAPILLARY
GLUCOSE-CAPILLARY: 134 mg/dL — AB (ref 65–99)
GLUCOSE-CAPILLARY: 177 mg/dL — AB (ref 65–99)
GLUCOSE-CAPILLARY: 134 mg/dL — AB (ref 65–99)

## 2017-05-25 NOTE — Progress Notes (Addendum)
    I was informed by pathology that the rectal mass biopsies show invasive adenocarcinoma.  I have not yet discussed the final dx with patient.   Gatha Mayer, MD, [facility] Gastroenterology [phone] (pager) 05/25/2017 7:33 AM  9:22 AM  I have now spoken to him and explained the diagnosis of rectal cancer.  How/if to treat this is the ? Now  Doubt surgery likely but will need consultations with surgery, mediocal oncology and possibly radiation oncology. He may need an endoscopic ultrasound also - outpatient procedure usually.  I think appropriate and ok to do that as an outpatient perhaps as we typically have certain specialists deal with this type of problem and given his co-morbidities and age that is even more important I think. Calling for consults ok here in my opinion but be prepared to consider scheduling outpatient evaluations.  I think he will need something to control the bleeding at least and I realize that could be something that keeps him here but needs careful consideration of best therapeutic approach that requires multiple specialists.  Let me know if ? - my role done at this point but happy to help more if needed.  Gatha Mayer, MD, [facility] Gastroenterology [phone] (pager) 05/25/2017 9:27 AM

## 2017-05-25 NOTE — Progress Notes (Signed)
   Subjective:  Luke Johnson is resting comfortably in bed this morning. He denies any abdominal pain, diarrhea, bloody or melanic stools today. Discussed the results of the biopsy this morning. He reported understanding but had few questions. Reports he has no living family members or social support. Lives at an assisted living facility.   Objective:  Vital signs in last 24 hours: Vitals:   05/24/17 0603 05/24/17 1346 05/24/17 2013 05/25/17 0516  BP: 125/63 124/64 126/62 128/64  Pulse: 60 62 64 69  Resp: 20 20 20 20   Temp: 98.1 F (36.7 C) 98.5 F (36.9 C) 98.1 F (36.7 C) 98.2 F (36.8 C)  TempSrc: Oral Oral Oral Oral  SpO2: 99% 98% 98% 98%  Weight:      Height:       GENERAL- thin, frail, elderly gentleman lying in bed in no distress CARDIAC- RRR, no murmurs, rubs or gallops. RESP- CTAB ABDOMEN- Soft, nontender, bowel sounds present. SKIN- Warm, dry, No rash or lesion.   Assessment/Plan:  Invasive Adenocarcinoma of the Rectum: Patient is very withdrawn and difficult to engage in conversation. He has no social support whatsoever. It is unlikely that he would be a good candidate for surgical intervention and is more likely only a candidate for palliative radiation however will need to have evaluations and discuss his options. Would like to ensure outpatient follow up is arranged prior to discharge given his lack of social support. This will be difficult to do over the weekend.   VTE prophylaxis: -SCDs when in bed -Ambulation when possible  Diet: -Advance diet as tolerated  Dispo: Anticipated discharge in approximately 1-2 day(s).   Maryellen Pile, MD 05/25/2017, 11:57 AM Pager: [phone]

## 2017-05-25 NOTE — Progress Notes (Signed)
Medicine attending: I examined this patient today together with resident physician Dr. Maryellen Pile and I concur with his evaluation and management plan which we discussed together.  He is hemodynamically stable.  Hemoglobin remained stable.  No further gross rectal bleeding has been reported. Pathology on biopsy of rectal lesion returned showing adenocarcinoma. We discussed this result with the patient. He currently resides in a nursing facility.  He is blind in one eye.  He has stage IIIB renal insufficiency bordering on stage IV.  He has no surviving family members.  No children.  He appears cachectic. CEA 4.6 therefore not a useful parameter to follow CT scan chest abdomen and pelvis with no gross metastatic disease although there may be some local perirectal lymph node involvement.  Impression: Adenocarcinoma of the distal rectum   He is not a candidate for aggressive surgery.  He would require an anterior posterior resection. A more palliative approach is indicated utilizing radiation with or without oral Capecitabine chemotherapy depending on his performance status and ability to come to the cancer center for treatments. I will initiate a referral to our GI oncologists.  In the interim I would recommend getting physical and occupational therapy involved to get a better assessment of his performance status and nutritional status.     Murriel Hopper, MD, [place]  Hematology-Oncology/Internal Medicine

## 2017-05-26 ENCOUNTER — Encounter (HOSPITAL_COMMUNITY): Payer: Self-pay | Admitting: Internal Medicine

## 2017-05-26 LAB — GLUCOSE, CAPILLARY
GLUCOSE-CAPILLARY: 113 mg/dL — AB (ref 65–99)
GLUCOSE-CAPILLARY: 158 mg/dL — AB (ref 65–99)
GLUCOSE-CAPILLARY: 129 mg/dL — AB (ref 65–99)
Glucose-Capillary: 105 mg/dL — ABNORMAL HIGH (ref 65–99)
Glucose-Capillary: 119 mg/dL — ABNORMAL HIGH (ref 65–99)

## 2017-05-26 LAB — PSA: Prostatic Specific Antigen: 14.93 ng/mL — ABNORMAL HIGH (ref 0.00–4.00)

## 2017-05-26 NOTE — Evaluation (Signed)
Occupational Therapy Evaluation Patient Details Name: Luke Johnson MRN: [id number] DOB: 11-29-1925 Today's Date: 05/26/2017    History of Present Illness PATIENT HAS NEWLY DX RECTAL CANCER, PROSTATE CANCER. PNT. PMH: HTN, DM2, CHONIC KIDNEY DX, GLAUCOMA. PNT IS LOW VISION.    Clinical Impression   PATIENT APPEARS TO BE AT BASELINE. PATIENT WAS ABLE TO PERFORM  DRESSING TASKS AT SETUP LEVEL WHICH PNT STATES IS HIS BASELINE. PNT IS ABLE TO PERFORM SIT TO STAND AT S LEVEL. PNT IS MIN GUARD ASSIST FOR TRANSFERS WHICH ALSO APPEAR TO BE BASELINE FOR PATIENT. PNT RESIDES AT SNF AND PLANS TO RETURN THERE.     Follow Up Recommendations  SNF    Equipment Recommendations       Recommendations for Other Services       Precautions / Restrictions Precautions Precautions: Fall Restrictions Weight Bearing Restrictions: No      Mobility Bed Mobility Overal bed mobility: Modified Independent                Transfers Overall transfer level: Needs assistance   Transfers: Stand Pivot Transfers   Stand pivot transfers: Min guard            Balance                                           ADL either performed or assessed with clinical judgement   ADL Overall ADL's : At baseline                                       General ADL Comments:  (PATIENT IS SETUP WITH ADSL. )     Vision Baseline Vision/History: Legally blind;Glaucoma Patient Visual Report: No change from baseline       Perception     Praxis      Pertinent Vitals/Pain Pain Assessment: No/denies pain     Hand Dominance Right   Extremity/Trunk Assessment Upper Extremity Assessment Upper Extremity Assessment: Generalized weakness           Communication Communication Communication: No difficulties   Cognition Arousal/Alertness: Awake/alert Behavior During Therapy: WFL for tasks assessed/performed Overall Cognitive Status: Within Functional Limits for tasks  assessed                                     General Comments       Exercises     Shoulder Instructions      Home Living Family/patient expects to be discharged to:: Skilled nursing facility                                        Prior Functioning/Environment Level of Independence: Needs assistance    ADL's / Homemaking Assistance Needed:  (PNT WAS SETUP WITH BATHING AND DRESSING PER PNT. )            OT Problem List:        OT Treatment/Interventions:      OT Goals(Current goals can be found in the care plan section) Acute Rehab OT Goals Patient Stated Goal:  (TO GO HOME) OT Goal Formulation: With patient  OT Frequency:  Barriers to D/C:            Co-evaluation              AM-PAC PT "6 Clicks" Daily Activity     Outcome Measure Help from another person eating meals?: A Little Help from another person taking care of personal grooming?: A Little Help from another person toileting, which includes using toliet, bedpan, or urinal?: A Little Help from another person bathing (including washing, rinsing, drying)?: A Little Help from another person to put on and taking off regular upper body clothing?: A Little Help from another person to put on and taking off regular lower body clothing?: A Little 6 Click Score: 18   End of Session Equipment Utilized During Treatment: Gait belt;Other (comment)  Activity Tolerance: Patient tolerated treatment well Patient left: in bed;with call bell/phone within reach                   Time: 0920-1001 OT Time Calculation (min): 41 min Charges:  OT General Charges $OT Visit: 1 Procedure OT Evaluation $OT Eval Low Complexity: 1 Procedure OT Treatments $Self Care/Home Management : 8-22 mins G-Codes:     6 CLICKS 18  Christopher Hink 05/26/2017, 10:02 AM

## 2017-05-26 NOTE — Progress Notes (Signed)
   Subjective:  [name] was comfortable this morning on rounds. He does not have any acute complaints except that he wants to shave. He continues to deny abdominal pain and states that he has not had any bowel movements overnight.   Objective:  Vital signs in last 24 hours: Vitals:   05/25/17 0516 05/25/17 1330 05/25/17 2211 05/26/17 0532  BP: 128/64 (!) 135/55 131/63 (!) 146/57  Pulse: 69 61 60 62  Resp: 20 20 20 20   Temp: 98.2 F (36.8 C) 99.1 F (37.3 C) 98.9 F (37.2 C) 98.7 F (37.1 C)  TempSrc: Oral Oral Oral Oral  SpO2: 98% 98% 98% 100%  Weight:      Height:       Physical Exam  Constitutional:  Elderly man laying comfortably in bed this morning in no acute distress.  Cardiovascular: Normal rate, regular rhythm and normal heart sounds.   No murmur heard. 2+ radial pulses  Pulmonary/Chest: Effort normal. No respiratory distress. He has no wheezes.  Abdominal: Soft. Bowel sounds are normal. He exhibits no distension. There is no tenderness. There is no guarding.  Musculoskeletal: He exhibits no edema (of bilateral lower extremities) or tenderness (of bilateral lower extremities).  Skin: Skin is warm and dry. No erythema. No pallor.   Assessment/Plan:  Principal Problem:   BRBPR (bright red blood per rectum) Active Problems:   Prostate cancer (HCC)   HTN (hypertension)   Type 2 diabetes mellitus (HCC)   Rectal cancer (HCC)   Rectal mass   Rectal bleeding   Chronic renal insufficiency, stage III (moderate)  [name] is an 83 yo with a PMH of prostate cancer s/p brachytherapy with biochemical recurrence in 2015, stage 3 CKD, glaucoma, and T2DM who presented to the ED after caregivers at living facility noticed bright red blood per rectum on the day prior to admission. The patient was asymptomatic. He was admitted to the internal medicine teaching service with consultations from gastroenterology during his hospitalization. The problems addressed during his  admission are as follows:   Invasive Adenocarcinoma of the Rectum: Patient presented painless lower GI bleeding, which was initially thought to be diverticular or hemorrhoidal. Colonoscopy revealed an ulcerating mass in the distal rectum that was identified as an invasive adenocarcinoma by surgical pathology. The patient's CT of the chest, abdomen, and pelvis CT showed no definitive distant metastases to the lungs, but identified small perirectal and presacral lymph nodes concerning for metastasis. The patient is not a candidate for surgical intervention and will require close follow up with radiation and medical oncology for treatment. Close follow up will be imperative, as the patient does not have a support system nearby. -CEA = 4.6 -Follow up with GI oncology on Monday 8/13 to arrange close follow up as an outpatient after discharge -PT/OT evaluation for performance status, appreciate recommendations  Hx of prostate cancer with biochemical recurrence in 2015:  -Continue home bicalutamide   Hx of hypertension with stage 3 CKD: The patient does not take medications for this at home and has remained hemodynamically stable and normotensive. Per chart review his Cr of 1.99 on arrival was at historical baseline.  -Continue to monitor  VTE prophylaxis: -SCDs when in bed -Ambulation when possible  Diet: -Advance diet as tolerated  Dispo: Anticipated discharge in approximately 1-2 day(s).   Thomasene Ripple, MD 05/26/2017, 6:55 AM Pager: [phone]

## 2017-05-27 ENCOUNTER — Ambulatory Visit
Admit: 2017-05-27 | Discharge: 2017-05-27 | Disposition: A | Discharge status: Home / Self Care | Payer: Medicare Other | Source: Ambulatory Visit | Attending: Radiation Oncology | Admitting: Radiation Oncology

## 2017-05-27 DIAGNOSIS — C775 Secondary and unspecified malignant neoplasm of intrapelvic lymph nodes: Secondary | ICD-10-CM | POA: Insufficient documentation

## 2017-05-27 DIAGNOSIS — K625 Hemorrhage of anus and rectum: Secondary | ICD-10-CM

## 2017-05-27 DIAGNOSIS — C61 Malignant neoplasm of prostate: Secondary | ICD-10-CM

## 2017-05-27 DIAGNOSIS — Z79899 Other long term (current) drug therapy: Secondary | ICD-10-CM | POA: Insufficient documentation

## 2017-05-27 DIAGNOSIS — Z510 Encounter for antineoplastic radiation therapy: Secondary | ICD-10-CM | POA: Insufficient documentation

## 2017-05-27 DIAGNOSIS — Z8546 Personal history of malignant neoplasm of prostate: Secondary | ICD-10-CM | POA: Insufficient documentation

## 2017-05-27 DIAGNOSIS — C20 Malignant neoplasm of rectum: Secondary | ICD-10-CM | POA: Insufficient documentation

## 2017-05-27 DIAGNOSIS — Z923 Personal history of irradiation: Secondary | ICD-10-CM | POA: Insufficient documentation

## 2017-05-27 LAB — GLUCOSE, CAPILLARY
GLUCOSE-CAPILLARY: 136 mg/dL — AB (ref 65–99)
GLUCOSE-CAPILLARY: 123 mg/dL — AB (ref 65–99)
Glucose-Capillary: 114 mg/dL — ABNORMAL HIGH (ref 65–99)
Glucose-Capillary: 163 mg/dL — ABNORMAL HIGH (ref 65–99)

## 2017-05-27 MED ORDER — ENSURE ENLIVE PO LIQD
237.0000 mL | Freq: Every day | ORAL | Status: DC
  Administered 2017-05-27: 237 mL via ORAL

## 2017-05-27 NOTE — Progress Notes (Signed)
Initial Nutrition Assessment  DOCUMENTATION CODES:   Not applicable  INTERVENTION:   -Ensure Enlive po q HS, each supplement provides 350 kcal and 20 grams of protein  NUTRITION DIAGNOSIS:   Increased nutrient needs related to cancer and cancer related treatments as evidenced by estimated needs.  GOAL:   Patient will meet greater than or equal to 90% of their needs  MONITOR:   PO intake, Supplement acceptance, Labs, Weight trends, Skin, I & O's  REASON FOR ASSESSMENT:   Consult Assessment of nutrition requirement/status  ASSESSMENT:   [name] is an 83 year old man with a history of prostate cancer, diabetes, hypertension, stage III chronic kidney disease, and glaucoma complicated by blindness who lives in an assisted living facility and who presents with a three-day history of bright red blood per rectum.   Pt admitted with rectal mass; per GI notes from 05/25/17 biopsies were positive for adenocarcinoma. Pt is awaiting GI oncology consult.   Pt covered in blankets at time of visit. Interview and nutrition-focused physical exam deferred at this time.   Meal completion records noted; pt consumes 80-100% of two meals daily, however, consumed 0% of lunch yesterday.   Wt hx reviewed. Wt has been stable for > 1 year.   Reviewed MAR from [facility]; pertinent orders include MVI, vitamin B-12, vitamin D-3, lasix, remeron, 10 units 70/30 humulin after breakfast daily.   Medications reviewed and include remeron, MVI, and vitamin B-12.   Labs reviewed: CBGS: 114-158.  Diet Order:  Diet regular Room service appropriate? Yes; Fluid consistency: Thin  Skin:  Reviewed, no issues  Last BM:  05/26/17  Height:   Ht Readings from Last 1 Encounters:  05/23/17 5\' 8"  (1.727 m)    Weight:   Wt Readings from Last 1 Encounters:  05/23/17 162 lb (73.5 kg)    Ideal Body Weight:  70 kg  BMI:  Body mass index is 24.63 kg/m.  Estimated Nutritional Needs:   Kcal:   1600-1800  Protein:  75-90 grams  Fluid:  1.6-1.8 L  EDUCATION NEEDS:   Education needs no appropriate at this time  Latesha Chesney A. Jimmye Norman, RD, LDN, CDE Pager: [phone] After hours Pager: [phone]

## 2017-05-27 NOTE — Consult Note (Signed)
[place]         [id number] ________________________________  Initial inpatient Consultation  Name: Luke Johnson MRN: [id number]  Date: 05/22/2017  DOB: [birth year]  REFERRING PHYSICIAN: Dr. Murriel Hopper  DIAGNOSIS: 83 yo man with rectal bleeding from adenocarcinoma of the rectum    ICD-10-CM   1. Rectal bleeding K62.5     HISTORY OF PRESENT ILLNESS::Luke Johnson is a 83 y.o. male with a history of prostate cancer treated over 15 years ago with brachytherapy. He also has diabetes, hypertension, and stage III chronic kidney disease with glaucoma complicated by blindness. He lives in a skilled nursing facility. He presented for admission with a 3 day history of bright red blood per rectum. He was noted to have soaked through 2 depends with bright red blood per rectum. He was taking aspirin 81 mg daily. He had colonoscopy in 2003 which revealed hyperplastic polyps. On this admission, patient was found to have a distal rectal mass 1 cm above the anal verge. Colonoscopy with Dr Silvano Rusk confirmed a rectal tumor and biopsy revealed invasive adenocarcinoma. CT scans of the chest abdomen and pelvis show some borderline perirectal adenopathy with no distant metastatic disease. The patient has kindly been referred today for discussion of potential palliative radiation treatment options .  PREVIOUS RADIATION THERAPY: Yes prostate seed implant performed in [place] according to the patient at least 15 years ago. This procedure predates our current electronic medical record as well as our most recent [facility] record.  The brachytherapy seeds are visible on his CT imaging.  Past Medical History:  Diagnosis Date  . Anemia, unspecified   . 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 Carcinoma in situ of prostate   . Chronic kidney disease, stage III  (moderate)   . Diabetes mellitus   . Dyslipidemia   . Edema   . Legal blindness, as defined in [place]   . Obesity, unspecified   . Other and unspecified hyperlipidemia   . Type II or unspecified type diabetes mellitus with renal manifestations, not stated as uncontrolled(250.40)   :  Past Surgical History:  Procedure Laterality Date  . COLONOSCOPY  multiple  . COLONOSCOPY N/A 05/23/2017   Procedure: COLONOSCOPY;  Surgeon: Gatha Mayer, MD;  Location[facility] ENDOSCOPY;  Service: Endoscopy;  Laterality: N/A;  . CYSTOSCOPY/RETROGRADE/URETEROSCOPY/STONE EXTRACTION WITH BASKET Left 08/04/2014   Procedure: CYSTOSCOPY/RETROGRADE/URETEROSCOPY/STONE EXTRACTION WITH BASKET;  Surgeon: Raynelle Bring, MD;  Location: [facility] ORS;  Service: Urology;  Laterality: Left;  :   Current Facility-Administered Medications:  .  acetaminophen (TYLENOL) tablet 650 mg, 650 mg, Oral, Q6H PRN **OR** acetaminophen (TYLENOL) suppository 650 mg, 650 mg, Rectal, Q6H PRN, Zada Finders, MD .  atorvastatin (LIPITOR) tablet 20 mg, 20 mg, Oral, q1800, Nedrud, Marybeth, MD, 20 mg at 05/26/17 1647 .  bicalutamide (CASODEX) tablet 50 mg, 50 mg, Oral, Daily, Nedrud, Marybeth, MD, 50 mg at 05/27/17 1000 .  feeding supplement (ENSURE ENLIVE) (ENSURE ENLIVE) liquid 237 mL, 237 mL, Oral, QHS, Nedrud, Marybeth, MD .  mirtazapine (REMERON) tablet 15 mg, 15 mg, Oral, QHS, Nedrud, Marybeth, MD, 15 mg at 05/26/17 2135 .  multivitamin with minerals tablet 1 tablet, 1 tablet, Oral, Daily, Nedrud, Marybeth, MD, 1 tablet at 05/27/17 1000 .  pneumococcal 23 valent vaccine (PNU-IMMUNE) injection 0.5 mL, 0.5 mL, Intramuscular, Prior to discharge, Oval Linsey, MD .  senna-docusate (Senokot-S) tablet  1 tablet, 1 tablet, Oral, QHS, Nedrud, Marybeth, MD, 1 tablet at 05/26/17 2135 .  vitamin B-12 (CYANOCOBALAMIN) tablet 500 mcg, 500 mcg, Oral, Daily, Nedrud, Marybeth, MD, 500 mcg at 05/27/17 1000:  No Known Allergies:  History reviewed. No pertinent  family history.:  Social History   Social History  . Marital status: Single    Spouse name: N/A  . Number of children: N/A  . Years of education: N/A   Occupational History  . Not on file.   Social History Main Topics  . Smoking status: Former Research scientist (life sciences)  . Smokeless tobacco: Never Used  . Alcohol use No  . Drug use: No  . Sexual activity: No   Other Topics Concern  . Not on file   Social History Narrative  . No narrative on file  :  REVIEW OF SYSTEMS:  A 15 point review of systems is documented in the electronic medical record. This was obtained by the nursing staff. However, I reviewed this with the patient to discuss relevant findings and make appropriate changes.  Pertinent items are noted in HPI.   PHYSICAL EXAM:  Blood pressure (!) 132/52, pulse 65, temperature 98.7 F (37.1 C), temperature source Oral, resp. rate (!) 189, height 5\' 8"  (1.727 m), weight 162 lb (73.5 kg), SpO2 99 %.  patient is an ill-appearing elderly gentleman in no acute distress. He is responsive to questions. He is alert and oriented to person time place and situation. He is somewhat cachectic. Further detailed physical exams deferred at this time.  KPS = 40  100 - Normal; no complaints; no evidence of disease. 90   - Able to carry on normal activity; minor signs or symptoms of disease. 80   - Normal activity with effort; some signs or symptoms of disease. 49   - Cares for self; unable to carry on normal activity or to do active work. 60   - Requires occasional assistance, but is able to care for most of his personal needs. 50   - Requires considerable assistance and frequent medical care. 13   - Disabled; requires special care and assistance. 75   - Severely disabled; hospital admission is indicated although death not imminent. 98   - Very sick; hospital admission necessary; active supportive treatment necessary. 10   - Moribund; fatal processes progressing rapidly. 0     - Dead  Karnofsky DA,  Abelmann May, Craver LS and Burchenal [facility] [phone]) The use of the nitrogen mustards in the palliative treatment of carcinoma: with particular reference to bronchogenic carcinoma Cancer 1 634-56  LABORATORY DATA:  Lab Results  Component Value Date   WBC 5.2 05/23/2017   HGB 10.5 (L) 05/23/2017   HCT 32.9 (L) 05/23/2017   MCV 80.8 05/23/2017   PLT 187 05/23/2017   Lab Results  Component Value Date   NA 139 05/23/2017   K 4.6 05/23/2017   CL 107 05/23/2017   CO2 21 (L) 05/23/2017   Lab Results  Component Value Date   ALT 14 (L) 05/22/2017   AST 25 05/22/2017   ALKPHOS 93 05/22/2017   BILITOT 0.7 05/22/2017     RADIOGRAPHY: Ct Abdomen Pelvis Wo Contrast  Result Date: 05/24/2017 CLINICAL DATA:  Rectal cancer staging. EXAM: CT CHEST, ABDOMEN AND PELVIS WITHOUT CONTRAST TECHNIQUE: Multidetector CT imaging of the chest, abdomen and pelvis was performed following the standard protocol without IV contrast. COMPARISON:  Abdomen and pelvis CT 08/03/2014. FINDINGS: CT CHEST FINDINGS Cardiovascular: The heart size is normal. No  pericardial effusion. Coronary artery calcification is noted. Atherosclerotic calcification is noted in the wall of the thoracic aorta. Mediastinum/Nodes: No mediastinal lymphadenopathy. No evidence for gross hilar lymphadenopathy although assessment is limited by the lack of intravenous contrast on today's study. Large hiatal hernia noted, containing nearly the entire stomach. There is no axillary lymphadenopathy. Lungs/Pleura: Small focus of architectural distortion identified right apex on image 21 series for. 3 mm right upper lobe nodule identified on image 57. 3 mm right middle lobe nodule seen posteriorly on image 70. 4 mm right lower lobe nodule is seen on image 88. Compressive atelectasis identified left lower lobe around the hiatal hernia. Atelectasis or scarring noted in the inferior lingula. Musculoskeletal: Bone windows reveal no worrisome lytic or sclerotic osseous  lesions. Bilateral gynecomastia evident. CT ABDOMEN PELVIS FINDINGS Hepatobiliary: 8 mm low-density lesion in the extreme hepatic dome, adjacent to the IVC was not well seen on the prior study but was present on an exam from 09/28/2010 and is probably a cyst. 5 mm low-density lesion in the inferior right liver was present on the prior study. Otherwise no focal abnormality is evident in the liver on this noncontrast exam. Gallbladder is decompressed. No intrahepatic or extrahepatic biliary dilation. Pancreas: No focal mass lesion. No dilatation of the main duct. No intraparenchymal cyst. No peripancreatic edema. Spleen: No splenomegaly. No focal mass lesion. Adrenals/Urinary Tract: No adrenal nodule or mass. Kidneys are unremarkable. No evidence for hydroureter. Anterior bladder wall is distorted, tracking just into a right groin hernia. Stomach/Bowel: Nearly the entire stomach is contained within a hiatal hernia. Duodenum is normally positioned as is the ligament of Treitz. No small bowel wall thickening. No small bowel dilatation. The terminal ileum is normal. The appendix is normal. No gross colonic mass. No colonic wall thickening. No substantial diverticular change. Mild wall irregularity is seen along the anterior aspect of the distal rectum in this patient with known rectal carcinoma. Vascular/Lymphatic: There is abdominal aortic atherosclerosis without aneurysm. There is no gastrohepatic or hepatoduodenal ligament lymphadenopathy. No intraperitoneal or retroperitoneal lymphadenopathy. No pelvic sidewall lymphadenopathy. Small 6 mm short axis perirectal lymph node is identified in presacral space (image 105 of series 3). Additional 7 and 5 mm short axis lymph nodes are seen more cranially in the presacral space (image 97 series 3). Reproductive: Brachytherapy seeds are noted in the prostate gland with probable central TURP defect. Other: No intraperitoneal free fluid. Musculoskeletal: Osseous mineralization is  heterogeneous, presumably related to osteopenia. Interval development of multiple thoracolumbar Schmorl's nodes evident. Prominent heterogeneous mineralization of the right iliac bone is stable comparing to 08/03/2014. Right groin hernia contains fat and a knuckle of the anterior bladder wall. IMPRESSION: 1. Wall irregularity along the anterior rectum presumably represents the patient's known primary malignancy. 2. Small perirectal and presacral lymph nodes concerning for metastatic disease. 3. Several tiny right pulmonary nodules, too small to characterize. Metastatic disease not excluded and close attention on follow-up recommended. Small focus of architectural distortion in the right upper lobe could also be reassessed at the time of followup imaging. 4. Tiny low-density liver lesions seen on prior imaging studies and likely indicative of cysts. No definite metastatic disease to the liver although lack of intravenous contrast lessens sensitivity for detection. 5. Large hiatal hernia contains nearly the entire stomach. 6. Right groin hernia contains a knuckle of the anterior bladder wall. 7. Heterogeneous bony mineralization without definite metastatic disease in the osseous anatomy. 8.  Aortic Atherosclerois (ICD10-170.0) Electronically Signed   By: Randall Hiss  Tery Sanfilippo M.D.   On: 05/24/2017 16:21   Ct Chest Wo Contrast  Result Date: 05/24/2017 CLINICAL DATA:  Rectal cancer staging. EXAM: CT CHEST, ABDOMEN AND PELVIS WITHOUT CONTRAST TECHNIQUE: Multidetector CT imaging of the chest, abdomen and pelvis was performed following the standard protocol without IV contrast. COMPARISON:  Abdomen and pelvis CT 08/03/2014. FINDINGS: CT CHEST FINDINGS Cardiovascular: The heart size is normal. No pericardial effusion. Coronary artery calcification is noted. Atherosclerotic calcification is noted in the wall of the thoracic aorta. Mediastinum/Nodes: No mediastinal lymphadenopathy. No evidence for gross hilar lymphadenopathy  although assessment is limited by the lack of intravenous contrast on today's study. Large hiatal hernia noted, containing nearly the entire stomach. There is no axillary lymphadenopathy. Lungs/Pleura: Small focus of architectural distortion identified right apex on image 21 series for. 3 mm right upper lobe nodule identified on image 57. 3 mm right middle lobe nodule seen posteriorly on image 70. 4 mm right lower lobe nodule is seen on image 88. Compressive atelectasis identified left lower lobe around the hiatal hernia. Atelectasis or scarring noted in the inferior lingula. Musculoskeletal: Bone windows reveal no worrisome lytic or sclerotic osseous lesions. Bilateral gynecomastia evident. CT ABDOMEN PELVIS FINDINGS Hepatobiliary: 8 mm low-density lesion in the extreme hepatic dome, adjacent to the IVC was not well seen on the prior study but was present on an exam from 09/28/2010 and is probably a cyst. 5 mm low-density lesion in the inferior right liver was present on the prior study. Otherwise no focal abnormality is evident in the liver on this noncontrast exam. Gallbladder is decompressed. No intrahepatic or extrahepatic biliary dilation. Pancreas: No focal mass lesion. No dilatation of the main duct. No intraparenchymal cyst. No peripancreatic edema. Spleen: No splenomegaly. No focal mass lesion. Adrenals/Urinary Tract: No adrenal nodule or mass. Kidneys are unremarkable. No evidence for hydroureter. Anterior bladder wall is distorted, tracking just into a right groin hernia. Stomach/Bowel: Nearly the entire stomach is contained within a hiatal hernia. Duodenum is normally positioned as is the ligament of Treitz. No small bowel wall thickening. No small bowel dilatation. The terminal ileum is normal. The appendix is normal. No gross colonic mass. No colonic wall thickening. No substantial diverticular change. Mild wall irregularity is seen along the anterior aspect of the distal rectum in this patient with  known rectal carcinoma. Vascular/Lymphatic: There is abdominal aortic atherosclerosis without aneurysm. There is no gastrohepatic or hepatoduodenal ligament lymphadenopathy. No intraperitoneal or retroperitoneal lymphadenopathy. No pelvic sidewall lymphadenopathy. Small 6 mm short axis perirectal lymph node is identified in presacral space (image 105 of series 3). Additional 7 and 5 mm short axis lymph nodes are seen more cranially in the presacral space (image 97 series 3). Reproductive: Brachytherapy seeds are noted in the prostate gland with probable central TURP defect. Other: No intraperitoneal free fluid. Musculoskeletal: Osseous mineralization is heterogeneous, presumably related to osteopenia. Interval development of multiple thoracolumbar Schmorl's nodes evident. Prominent heterogeneous mineralization of the right iliac bone is stable comparing to 08/03/2014. Right groin hernia contains fat and a knuckle of the anterior bladder wall. IMPRESSION: 1. Wall irregularity along the anterior rectum presumably represents the patient's known primary malignancy. 2. Small perirectal and presacral lymph nodes concerning for metastatic disease. 3. Several tiny right pulmonary nodules, too small to characterize. Metastatic disease not excluded and close attention on follow-up recommended. Small focus of architectural distortion in the right upper lobe could also be reassessed at the time of followup imaging. 4. Tiny low-density liver lesions seen on prior  imaging studies and likely indicative of cysts. No definite metastatic disease to the liver although lack of intravenous contrast lessens sensitivity for detection. 5. Large hiatal hernia contains nearly the entire stomach. 6. Right groin hernia contains a knuckle of the anterior bladder wall. 7. Heterogeneous bony mineralization without definite metastatic disease in the osseous anatomy. 8.  Aortic Atherosclerois (ICD10-170.0) Electronically Signed   By: Misty Stanley  M.D.   On: 05/24/2017 16:21      IMPRESSION: 83 yo man with rectal bleeding from adenocarcinoma of the rectum.  Given the patient's advanced age and multiple medical comorbidities, the patient is not an ideal surgical candidate. In some cases, radiotherapy can be used for local control and palliation of rectal bleeding. However, the patient's previous seed implant would make additional radiation therapy to the distal rectum high risk. Specifically, patient may be at risk for development of a chronic wound in the rectum. Under these circumstances, I suspect reirradiation using a relatively low-dose for palliation of bleeding may be the better option over allowing continued tumor growth which would certainly reduce her risk for additional bleeding and possible obstruction.  PLAN:Today, I talked to the patient and family about the findings and work-up thus far.  We discussed the natural history of rectal cancer in terms of rectal bleeding and general treatment, highlighting the role of palliative radiotherapy in the management.  We discussed the available radiation techniques, and focused on the details of logistics and delivery.  We reviewed the anticipated acute and late sequelae associated with radiation in this setting.    The patient would like to consider radiation and will be scheduled for evaluation in the cancer center during the next week or so.  I spent 40 minutes minutes reviewing films, reviewing old records and face to face with the patient and more than 50% of that time was spent in counseling and/or coordination of care.   ------------------------------------------------   Tyler Pita, MD [place] Director and Director of Stereotactic Radiosurgery Direct Dial: [phone]  Fax: [phone] [email or web address]  Skype  [email or web address]

## 2017-05-27 NOTE — Progress Notes (Signed)
Subjective:  Luke Johnson was seen laying comfortably in bed this morning in no acute distress. States that he feels fine this morning and has no acute complaints. Denies any abdominal pain and difficulty with bowel movements. States that he walked around the unit yesterday and that he is ready to go home today.   Objective:  Vital signs in last 24 hours: Vitals:   05/26/17 1406 05/26/17 2236 05/27/17 0400 05/27/17 1311  BP: (!) 142/63 (!) 127/52 133/61 (!) 132/52  Pulse: 60 61 66 65  Resp: 18 17 (!) 189   Temp: 98.4 F (36.9 C) 98.6 F (37 C) 98.7 F (37.1 C) 98.7 F (37.1 C)  TempSrc: Oral Oral Oral Oral  SpO2: 100% 99% 99% 99%  Weight:      Height:       Physical Exam  Constitutional: He appears well-developed and well-nourished. No distress.  Cardiovascular: Normal rate, regular rhythm, normal heart sounds and intact distal pulses.  Exam reveals no friction rub.   No murmur heard. Pulmonary/Chest: Effort normal. No respiratory distress. He has no wheezes.  Abdominal: Soft. Bowel sounds are normal. He exhibits no distension. There is no tenderness. There is no guarding.  Musculoskeletal: He exhibits no edema (of bilateral lower extremities) or tenderness (of bilateral lower extremities).  Skin: Skin is warm and dry. No rash noted. No erythema.   Assessment/Plan:  Principal Problem:   BRBPR (bright red blood per rectum) Active Problems:   Prostate cancer (HCC)   HTN (hypertension)   Type 2 diabetes mellitus (HCC)   Rectal cancer (HCC)   Rectal mass   Rectal bleeding   Chronic renal insufficiency, stage III (moderate)  Luke Johnson is an 83 yo with a PMH of prostate cancer s/p brachytherapy with biochemical recurrence in 2015, stage 3 CKD, glaucoma, and T2DM who presented to the ED after caregivers at living facility noticed bright red blood per rectum. He was admitted to the internal medicine teaching service with consultations from gastroenterology during his  hospitalization. The problems addressed during his admission are as follows:   Invasive Adenocarcinoma of the Rectum: Patient presented painless lower GI bleeding, which was initially thought to be diverticular or hemorrhoidal. Colonoscopy revealed an ulcerating mass in the distal rectum that was identified as an invasive adenocarcinoma by surgical pathology. The patient's CT of the chest, abdomen, and pelvis CT showed no definitive distant metastases to the lungs, but identified small perirectal and presacral lymph nodes concerning for metastasis. The patient will require close follow up with radiation and medical oncology for treatment. Consultations initiated over weekend by Dr. Beryle Beams.  -CEA = 4.6 -Patient will have to follow up with GI oncology, arrange close follow up as an outpatient  -PT/OT evaluation for performance status. Suggest patient needs more help at home than he currently receives, recommend SNF.   Hx of prostate cancer with biochemical recurrence in 2015:  PSA today = 14.93, which is increased from 4.46 in 2015. Will contact surgical pathology to see if they would want to consider PSA staining on surgical pathology specimen obtained from biopsy.  [ ]  Follow up free and total testosterone -Continue home bicalutamide   Hx of hypertension with stage 3 CKD: The patient does not take medications for this at home and has remained hemodynamically stable and normotensive. Per chart review his Cr of 1.99 on arrival was at historical baseline.  -Continue to monitor  VTE prophylaxis: -SCDs when in bed -Ambulation when possible  Diet: -Advance diet as tolerated -  Nutrition consult on 8/13 suggests Ensure Enlive po qHS for protein and calorie supplementation  Dispo: Anticipated discharge in approximately 1 day pending discharge to SNF vs. ALF with social work.   Thomasene Ripple, MD 05/27/2017, 2:09 PM Pager: [phone]

## 2017-05-27 NOTE — Progress Notes (Signed)
Internal Medicine Attending  Date: 05/27/2017  Patient name: Luke Johnson Medical record number: [id number] Date of birth: 1925/11/05 Age: 83 y.o. Gender: male  I saw and evaluated the patient. I reviewed the resident's note by Dr. Berneice Gandy and I agree with the resident's findings and plans as documented in her progress note.  When seen on rounds this morning [name] was without complaints. He was evaluated by occupational therapy and physical therapy and the recommendation was for skilled nursing facility placement to work on ambulation and safe transfers. He was also seen by radiation oncology who discussed with him the options of palliative radiotherapy to control the bleeding. Further evaluation will be set up in the [facility] within the next week. At this point we are working on transfer to a skilled nursing facility from which he can receive palliative radiation therapy for his rectal adenocarcinoma.

## 2017-05-27 NOTE — Evaluation (Signed)
Physical Therapy Evaluation Patient Details Name: Luke Johnson MRN: [id number] DOB: 1925-11-04 Today's Date: 05/27/2017   History of Present Illness  83 yo admitted with GIB with new diagnosis of rectal and prostate CA. PMhx: HTN, DM, CKD, glaucoma-legally blind  Clinical Impression  Upon arrival pt seated at EOB using urinal. Pt agreeable to mobilize with PT. Pt  A&O x 2 (disoriented to place and situation). Pt able to ambulate in hall with + 2, as pt unstable and has decreased safety awareness and visual deficits. Pt continued to state "I can walk on my own" throughout session. Pt also continues to attempt to get out of the chair on his own despite safety education. Pt presents with deficits listed in PT problem list below and will benefit from continue acute therapy for increased mobilization, DME and safety education/training, and strengthening for safe discharge. Recommending plan for SNF with 24 hour supervision/assistance due to patient's limited mobility and decreased safety awareness.    Follow Up Recommendations SNF;Supervision/Assistance - 24 hour    Equipment Recommendations  None recommended by PT    Recommendations for Other Services OT consult     Precautions / Restrictions Precautions Precautions: Fall Precaution Comments: legally blind      Mobility  Bed Mobility               General bed mobility comments: pt EOB on arrival  Transfers Overall transfer level: Needs assistance   Transfers: Sit to/from Stand Sit to Stand: Min guard         General transfer comment: guarding for safety pt with intial posterior LOB on standing required 2 trials  Ambulation/Gait Ambulation/Gait assistance: Min assist;+2 safety/equipment Ambulation Distance (Feet): 150 Feet Assistive device: 1 person hand held assist Gait Pattern/deviations: Drifts right/left;Decreased stride length;Step-through pattern Gait velocity: decreased  Gait velocity interpretation: Below  normal speed for age/gender General Gait Details: Pt drifting d/t instability and required +2 assist for safety, using one PT's UE for support and guidance, with cane in other hand  Stairs            Wheelchair Mobility    Modified Rankin (Stroke Patients Only)       Balance Overall balance assessment: Needs assistance Sitting-balance support: Feet supported;Single extremity supported Sitting balance-Leahy Scale: Fair     Standing balance support: Bilateral upper extremity supported;During functional activity Standing balance-Leahy Scale: Poor Standing balance comment: Pt requires UE support for standing balance. Upon first sit to stand at EOB, pt had LOB posteriorly                              Pertinent Vitals/Pain Pain Assessment: No/denies pain    Home Living Family/patient expects to be discharged to:: Assisted living               Home Equipment: Kasandra Knudsen - single point      Prior Function Level of Independence: Needs assistance   Gait / Transfers Assistance Needed: ambulated with cane throughout ALF  ADL's / Homemaking Assistance Needed: pt states he could sit and bathe on his own with assist of staff to set out clothes        Hand Dominance        Extremity/Trunk Assessment   Upper Extremity Assessment Upper Extremity Assessment: Generalized weakness    Lower Extremity Assessment Lower Extremity Assessment: Generalized weakness    Cervical / Trunk Assessment Cervical / Trunk Assessment: Kyphotic  Communication  Communication: Other (comment) (mumbled speech at times)  Cognition Arousal/Alertness: Awake/alert Behavior During Therapy: WFL for tasks assessed/performed Overall Cognitive Status: Impaired/Different from baseline Area of Impairment: Safety/judgement;Problem solving                         Safety/Judgement: Decreased awareness of deficits;Decreased awareness of safety   Problem Solving: Slow  processing General Comments: pt with consistent statement of "I can do it as well as ya'll can" "you know that"      General Comments      Exercises     Assessment/Plan    PT Assessment Patient needs continued PT services  PT Problem List Decreased strength;Decreased activity tolerance;Decreased balance;Decreased mobility;Decreased cognition;Decreased knowledge of use of DME;Decreased safety awareness;Decreased knowledge of precautions       PT Treatment Interventions DME instruction;Gait training;Functional mobility training;Therapeutic activities;Therapeutic exercise;Balance training;Patient/family education;Cognitive remediation    PT Goals (Current goals can be found in the Care Plan section)  Acute Rehab PT Goals Patient Stated Goal: be independent  PT Goal Formulation: With patient Time For Goal Achievement: 06/10/17 Potential to Achieve Goals: Fair    Frequency Min 3X/week   Barriers to discharge        Co-evaluation               AM-PAC PT "6 Clicks" Daily Activity  Outcome Measure Difficulty turning over in bed (including adjusting bedclothes, sheets and blankets)?: A Little Difficulty moving from lying on back to sitting on the side of the bed? : Total Difficulty sitting down on and standing up from a chair with arms (e.g., wheelchair, bedside commode, etc,.)?: A Little Help needed moving to and from a bed to chair (including a wheelchair)?: A Little Help needed walking in hospital room?: A Little Help needed climbing 3-5 steps with a railing? : A Lot 6 Click Score: 15    End of Session Equipment Utilized During Treatment: Gait belt Activity Tolerance: Patient tolerated treatment well Patient left: in chair;with call bell/phone within reach;with chair alarm set Nurse Communication: Mobility status;Precautions PT Visit Diagnosis: Unsteadiness on feet (R26.81);Other abnormalities of gait and mobility (R26.89);Difficulty in walking, not elsewhere  classified (R26.2);Muscle weakness (generalized) (M62.81)    Time: 1610-9604 PT Time Calculation (min) (ACUTE ONLY): 22 min   Charges:   PT Evaluation $PT Eval Moderate Complexity: 1 Mod     PT G CodesElberta Leatherwood, SPT Acute Rehab [place] 05/27/2017, 10:10 AM

## 2017-05-28 ENCOUNTER — Encounter: Payer: Self-pay | Admitting: Radiation Oncology

## 2017-05-28 LAB — TESTOSTERONE,FREE AND TOTAL
Testosterone, Free: 2.9 pg/mL — ABNORMAL LOW (ref 6.6–18.1)
Testosterone: 306 ng/dL (ref 264–916)

## 2017-05-28 LAB — GLUCOSE, CAPILLARY
GLUCOSE-CAPILLARY: 119 mg/dL — AB (ref 65–99)
Glucose-Capillary: 102 mg/dL — ABNORMAL HIGH (ref 65–99)

## 2017-05-28 NOTE — Clinical Social Work Note (Signed)
Clinical Social Work Assessment  Patient Details  Name: Luke Johnson MRN: [id number] Date of Birth: [birth year]  Date of referral:  05/28/17               Reason for consult:  Discharge Planning                Permission sought to share information with:  Facility Sport and exercise psychologist, Family Supports Permission granted to share information::  Yes, Verbal Permission Granted  Name::     Mikey Kirschner [facility]) / Jaquita Folds  Agency::  SNFs/ [facility]  Relationship::  Suzie Portela Information:  [phone] / [phone]  Housing/Transportation Living arrangements for the past 2 months:  [place] ([place]) Source of Information:  Patient, Other (Comment Required) Jaquita Folds) Patient Interpreter Needed:  None Criminal Activity/Legal Involvement Pertinent to Current Situation/Hospitalization:  No - Comment as needed Significant Relationships:  Other Family Members Lives with:  Facility Resident Do you feel safe going back to the place where you live?  Yes Need for family participation in patient care:  Yes (Comment)  Care giving concerns:  No care giving concerns identified.    Social Worker assessment / plan:  CSW met with pt to address consult for new SNF. CSW introduced self and explained role of social work. Pt from [facility]. Pt gave CSW permission to contact cousins-Tommie Belenda Cruise and Mikey Kirschner about discharge plans. P/T is recommending STR at SNF. Pt agreeable to SNF. Pt wants CSW to call Tommie or Belenda Cruise for SNF decision. Pt agreeable for CSW to fax out to [facility].    CSW will sent FL-2 to SNFs. Pt left VM with Mikey Kirschner at [phone]. CSW spoke with Jaquita Folds at [phone], who stated pt to go to [facility], as he was there before for STR. CSW confirmed with Mardene Celeste at [facility] that pt able to come today. CSW sent all clinicals to [facility] through the [place]. RN aware and to call report to  [phone] for room 126B. CSW arranged transport with [facility]. CSW signing off as no further Social Work needs identified.    Employment status:  Retired Forensic scientist:  Information systems manager, Medicaid In [place] PT Recommendations:  [place] / Referral to community resources:  [place]  Patient/Family's Response to care:  Pt and family responded well and are appreciative of CSW support.   Patient/Family's Understanding of and Emotional Response to Diagnosis, Current Treatment, and Prognosis:  Pt appears to have understanding of current medical state.   Emotional Assessment Appearance:  Appears stated age Attitude/Demeanor/Rapport:  Other (Appropriate) Affect (typically observed):  Accepting, Adaptable, Pleasant Orientation:  Oriented to Self, Oriented to Place, Oriented to Situation Alcohol / Substance use:  Other Psych involvement (Current and /or in the community):  No (Comment)  Discharge Needs  Concerns to be addressed:  Discharge Planning Concerns, Care Coordination Readmission within the last 30 days:  No Current discharge risk:  Dependent with Mobility Barriers to Discharge:  Continued Medical Work up   [employer], LCSW 05/28/2017, 1:38 PM

## 2017-05-28 NOTE — Progress Notes (Signed)
Internal Medicine Attending  Date: 05/28/2017  Patient name: [name] Medical record number: [id number] Date of birth: May 05, 1926 Age: 83 y.o. Gender: male  I saw and evaluated the patient. I reviewed the resident's note by Dr. Berneice Gandy and I agree with the resident's findings and plans as documented in her progress note.  When seen on rounds this morning [name] was again without complaints. He is agreeable to palliative radiation therapy to help with rectal bleeding. He is also amenable to skilled nursing facility placement in the short-term while he undergoes this palliative radiation therapy. This will allow him to continue to get physical therapy to work on a safer gait as well as improvement in his transfers before returning to his assisted-living facility. I agree with discharge home today to a skilled nursing facility once a position becomes available.

## 2017-05-28 NOTE — Clinical Social Work Placement (Signed)
   CLINICAL SOCIAL WORK PLACEMENT  NOTE  Date:  05/28/2017  Patient Details  Name: Luke Johnson MRN: [id number] Date of Birth: [birth year]  Clinical Social Work is seeking post-discharge placement for this patient at the [place] level of care (*CSW will initial, date and re-position this form in  chart as items are completed):  Yes   Patient/family provided with [place] Work Department's list of facilities offering this level of care within the geographic area requested by the patient (or if unable, by the patient's family).  Yes   Patient/family informed of their freedom to choose among providers that offer the needed level of care, that participate in Medicare, Medicaid or managed care program needed by the patient, have an available bed and are willing to accept the patient.  Yes   Patient/family informed of [facility]'s ownership interest in [facility] and [facility], as well as of the fact that they are under no obligation to receive care at these facilities.  PASRR submitted to EDS on       PASRR number received on       Existing PASRR number confirmed on 05/28/17     FL2 transmitted to all facilities in geographic area requested by pt/family on 05/28/17     FL2 transmitted to all facilities within larger geographic area on       Patient informed that his/her managed care company has contracts with or will negotiate with certain facilities, including the following:        Yes   Patient/family informed of bed offers received.  Patient chooses bed at [facility]     Physician recommends and patient chooses bed at      Patient to be transferred to [facility] on 05/28/17.  Patient to be transferred to facility by [facility]     Patient family notified on 05/28/17 of transfer.  Name of family member notified:  Jaquita Folds [phone] / Left VM for Mikey Kirschner [phone]     PHYSICIAN Please prepare  prescriptions, Please sign FL2     Additional Comment:    _______________________________________________ Truitt Merle, LCSW 05/28/2017, 1:53 PM

## 2017-05-28 NOTE — Progress Notes (Signed)
Report called to [facility] at [phone] to [place].  Patient educated concerning care plan and discharge instructions with necessity for skilled nursing facility treatment.  Patient verbalized understanding.  Report given to LPN Hyacinth Meeker.  Patient to be escorted via ambulance [facility] to facility.

## 2017-05-28 NOTE — Progress Notes (Signed)
Subjective:  Luke Johnson was seen laying comfortably in bed this morning in no acute distress. He continues to deny abdominal pain and any other acute complaints. When asked about his visit with radiation oncology yesterday, the patient reports that he did speak with the physician. We reviewed the recommendations with the patient and stated that the plan for palliative radiation was a good option for him. We also discussed plans to go to a SNF today and the patient was agreeable to this plan.   Objective:  Vital signs in last 24 hours: Vitals:   05/27/17 0400 05/27/17 1311 05/27/17 2105 05/28/17 0532  BP: 133/61 (!) 132/52 140/84 140/82  Pulse: 66 65 70 72  Resp: (!) 189  16 16  Temp: 98.7 F (37.1 C) 98.7 F (37.1 C) 98.2 F (36.8 C) 98.3 F (36.8 C)  TempSrc: Oral Oral Axillary Axillary  SpO2: 99% 99% 100% 100%  Weight:      Height:       Physical Exam  Constitutional: He appears well-developed and well-nourished. No distress.  Cardiovascular: Normal rate, regular rhythm, normal heart sounds and intact distal pulses.   No murmur heard. Pulmonary/Chest: Effort normal and breath sounds normal. No respiratory distress.  Abdominal: Soft. Bowel sounds are normal. He exhibits no distension. There is no tenderness.  Musculoskeletal: He exhibits no edema (of bilateral lower extremities) or tenderness (of bilateral lower extremities).  Skin: Skin is warm and dry. No rash noted. He is not diaphoretic. No erythema.   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 He was admitted to the internal medicine teaching service with consultations from gastroenterology and radiation oncology during his hospitalization. The problems addressed during his admission are as follows:   Invasive Adenocarcinoma of the Rectum: Patient presented painless lower GI bleeding, which was initially thought to be diverticular or hemorrhoidal. Colonoscopy revealed an ulcerating mass in the distal rectum that was identified as an invasive adenocarcinoma by surgical pathology. The patient's CT of the chest, abdomen, and pelvis CT showed no definitive distant metastases to the lungs, but identified small perirectal and presacral lymph nodes concerning for metastasis. The patient's PT/OT evaluations showed poor performance status, which along with his comorbidities, make him poor surgical candidate. The patient saw radiation oncology yesterday, who suggested low dose palliative radiation as an outpatient to prevent further bleeding and possible obstruction from this mass in the future. The patient is agreeable to this plan.  -PT/OT evaluation shows low performance status recommend SNF rather than ALF. Social work helping with placement, their efforts are appreciated.   Hx of prostate cancer with biochemical recurrence in 2015:  PSA today = 14.93, which is increased from 4.46 in 2015. Will contact surgical pathology to see if they would want to consider PSA staining on surgical pathology specimen obtained from biopsy.  [ ]  Follow up free and total testosterone -Continue home bicalutamide   Hx of hypertension with stage 3 CKD: The patient does not take  medications for this at home and has remained hemodynamically stable and normotensive. Per chart review his Cr of 1.99 on arrival was at historical baseline.   VTE prophylaxis: -SCDs when in bed -Ambulation when possible  Diet: -Advance diet as  tolerated -Continue Ensure Enlive po qHS for protein and calorie supplementation  Dispo: Anticipated discharge in approximately 1 day.   Thomasene Ripple, MD 05/28/2017, 9:49 AM Pager: [phone]

## 2017-05-28 NOTE — Discharge Instructions (Signed)
Please follow up with Dr. Tammi Klippel with Radiation Oncology on Wednesday 06/06/2107 at 2:30 pm at the [facility]. He will speak with you further about your treatment plan.

## 2017-05-28 NOTE — Progress Notes (Signed)
Notified [facility] that patient's IV became dislodged. No IV PRN medications or scheduled IV medications. W[facility] stated okay to leave IV out.

## 2017-05-28 NOTE — NC FL2 (Signed)
[place] LEVEL OF CARE SCREENING TOOL     IDENTIFICATION  Patient Name: Luke Johnson Birthdate: [birth year] Sex: male Admission Date (Current Location): 05/22/2017  [facility] and [place] Number:  Herbalist and Address:  [facility] [place] [place] [address] [place], [place] [address]      Provider Number: [id number]  Attending Physician Name and Address:  Oval Linsey, MD  Relative Name and Phone Number:  POA Mikey Kirschner [phone]    Current Level of Care: Hospital Recommended Level of Care: [place] Prior Approval Number:    Date Approved/Denied:   PASRR Number: [id number]  Discharge Plan: SNF    Current Diagnoses: Patient Active Problem List   Diagnosis Date Noted  . Chronic renal insufficiency, stage III (moderate)   . Rectal bleeding   . 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 Rectal mass   . BRBPR (bright red blood per rectum) 05/22/2017  . Type 2 diabetes mellitus ([place]) 05/22/2017  . Protein-calorie malnutrition, severe ([place]) 08/06/2014  . UTI (lower urinary tract infection) 07/30/2014  . Prostate cancer ([place]) 07/30/2014  . HTN (hypertension) 07/30/2014  . Dyslipidemia 07/30/2014  . Hyperglycemia 07/30/2014  . Anemia 07/30/2014    Orientation RESPIRATION BLADDER Height & Weight     Self, Situation, Place  Normal Incontinent Weight: 162 lb (73.5 kg) Height:  5\' 8"  (172.7 cm)  BEHAVIORAL SYMPTOMS/MOOD NEUROLOGICAL BOWEL NUTRITION STATUS      Incontinent Diet (Regular diet; thin fluids)  AMBULATORY STATUS COMMUNICATION OF NEEDS Skin   Limited Assist Verbally Normal                       Personal Care Assistance Level of Assistance  Bathing, Feeding, Dressing Bathing Assistance: Limited assistance Feeding assistance: Independent Dressing Assistance: Limited assistance     Functional Limitations Info  Sight, Hearing, Speech Sight Info: Impaired  (Blind) Hearing Info: Impaired (Hard of Hearing) Speech Info: Adequate    SPECIAL CARE FACTORS FREQUENCY  PT (By licensed PT), OT (By licensed OT)     PT Frequency: 5x OT Frequency: 5x            Contractures Contractures Info: Not present    Additional Factors Info  Code Status, Allergies Code Status Info: Full Allergies Info: No Known Allergies           Current Medications (05/28/2017):  This is the current hospital active medication list Current Facility-Administered Medications  Medication Dose Route Frequency Provider Last Rate Last Dose  . acetaminophen (TYLENOL) tablet 650 mg  650 mg Oral Q6H PRN Zada Finders, MD       Or  . acetaminophen (TYLENOL) suppository 650 mg  650 mg Rectal Q6H PRN Zada Finders, MD      . atorvastatin (LIPITOR) tablet 20 mg  20 mg Oral q1800 Nedrud, Larena Glassman, MD   20 mg at 05/27/17 1735  . bicalutamide (CASODEX) tablet 50 mg  50 mg Oral Daily Nedrud, Larena Glassman, MD   50 mg at 05/28/17 0904  . feeding supplement (ENSURE ENLIVE) (ENSURE ENLIVE) liquid 237 mL  237 mL Oral QHS Nedrud, Larena Glassman, MD   237 mL at 05/27/17 2103  . mirtazapine (REMERON) tablet 15 mg  15 mg Oral QHS Nedrud, Larena Glassman, MD   15 mg at 05/27/17 2103  . multivitamin with minerals tablet 1 tablet  1 tablet Oral Daily Thomasene Ripple, MD   1 tablet at 05/28/17 0905  .  pneumococcal 23 valent vaccine (PNU-IMMUNE) injection 0.5 mL  0.5 mL Intramuscular Prior to discharge Oval Linsey, MD      . senna-docusate (Senokot-S) tablet 1 tablet  1 tablet Oral QHS Thomasene Ripple, MD   1 tablet at 05/27/17 2103  . vitamin B-12 (CYANOCOBALAMIN) tablet 500 mcg  500 mcg Oral Daily Thomasene Ripple, MD   500 mcg at 05/28/17 5974     Discharge Medications: Please see discharge summary for a list of discharge medications.  Relevant Imaging Results:  Relevant Lab Results:   Additional Information SSN: [id number]  Truitt Merle, LCSW

## 2017-05-29 ENCOUNTER — Encounter: Payer: Self-pay | Admitting: *Deleted

## 2017-05-29 ENCOUNTER — Telehealth: Payer: Self-pay | Admitting: Oncology

## 2017-05-29 NOTE — Progress Notes (Signed)
Patient is currently at [facility] [place].  Patient was ambulating with physical therapy today and is appropriate for wheelchair transportation to his CT Sim appointment on 05/30/2017 according to [place] and Biomedical scientist for [facility].  Patient main issue with ambulating is poor vision.  They will coordinate transportation with [facility] [phone].  We will need to call them to pick up upon completion of his appointment with Dr. Tammi Klippel tomorrow.

## 2017-05-29 NOTE — Telephone Encounter (Signed)
Appt has been scheduled for the pt to see Mikey Bussing at 930am. Scheduled with [employer] at [facility].

## 2017-05-30 ENCOUNTER — Ambulatory Visit
Admission: RE | Admit: 2017-05-30 | Discharge: 2017-05-30 | Disposition: A | Discharge status: Home / Self Care | Payer: Medicare Other | Source: Ambulatory Visit | Attending: Radiation Oncology | Admitting: Radiation Oncology

## 2017-05-30 DIAGNOSIS — C775 Secondary and unspecified malignant neoplasm of intrapelvic lymph nodes: Principal | ICD-10-CM

## 2017-05-30 DIAGNOSIS — Z8546 Personal history of malignant neoplasm of prostate: Secondary | ICD-10-CM | POA: Diagnosis not present

## 2017-05-30 DIAGNOSIS — C20 Malignant neoplasm of rectum: Secondary | ICD-10-CM

## 2017-05-30 DIAGNOSIS — Z510 Encounter for antineoplastic radiation therapy: Secondary | ICD-10-CM | POA: Diagnosis present

## 2017-05-30 DIAGNOSIS — Z923 Personal history of irradiation: Secondary | ICD-10-CM | POA: Diagnosis not present

## 2017-05-30 DIAGNOSIS — K625 Hemorrhage of anus and rectum: Secondary | ICD-10-CM | POA: Diagnosis not present

## 2017-05-30 DIAGNOSIS — Z79899 Other long term (current) drug therapy: Secondary | ICD-10-CM | POA: Diagnosis not present

## 2017-05-30 NOTE — Progress Notes (Signed)
  Radiation Oncology         [phone] ________________________________  Name: Luke Johnson MRN: [id number]  Date: 05/30/2017  DOB: 1926-08-18  SIMULATION AND TREATMENT PLANNING NOTE    ICD-10-CM   1. 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  NARRATIVE:  The patient was brought to the [facility].  Identity was confirmed.  All relevant records and images related to the planned course of therapy were reviewed.  The patient freely provided informed written consent to proceed with treatment after reviewing the details related to the planned course of therapy. The consent form was witnessed and verified by the simulation staff.  Then, the patient was set-up in a stable reproducible  supine position for radiation therapy.  CT images were obtained.  Surface markings were placed.  The CT images were loaded into the planning software.  Then the target and avoidance structures were contoured.  Treatment planning then occurred.  The radiation prescription was entered and confirmed.  Then, I designed and supervised the construction of a total of 6 medically necessary complex treatment devices with Vac-Loc positioner and 5 MLCs.  I have requested : 3D Simulation  I have requested a DVH of the following structures: Lt femur, Rt Femur, Bladder and target.    SPECIAL TREATMENT PROCEDURE:  The planned course of therapy using radiation constitutes a special treatment procedure. Special care is required in the management of this patient for the following reasons. This treatment constitutes a Special Treatment Procedure for the following reason: [ Retreatment in a previously radiated area requiring careful monitoring of increased risk of toxicity due to overlap of previous treatment..  The special nature of the planned course of radiotherapy  will require increased physician supervision and oversight to ensure patient's safety with optimal treatment outcomes.   PLAN:  The patient will receive 30 Gy in 10 fraction.  ________________________________  Sheral Apley Tammi Klippel, M.D. This document serves as a record of services personally performed by Tyler Pita, MD. It was created on his behalf by Valeta Harms, a trained medical scribe. The creation of this record is based on the scribe's personal observations and the provider's statements to them. This document has been checked and approved by the attending provider.

## 2017-05-31 ENCOUNTER — Telehealth: Payer: Self-pay | Admitting: Oncology

## 2017-05-31 ENCOUNTER — Encounter: Payer: Self-pay | Admitting: Oncology

## 2017-05-31 ENCOUNTER — Ambulatory Visit (HOSPITAL_BASED_OUTPATIENT_CLINIC_OR_DEPARTMENT_OTHER): Payer: Medicare Other | Admitting: Oncology

## 2017-05-31 VITALS — BP 147/52 | HR 76 | Temp 97.8°F | Resp 18 | Ht 68.0 in | Wt 169.9 lb

## 2017-05-31 DIAGNOSIS — C775 Secondary and unspecified malignant neoplasm of intrapelvic lymph nodes: Secondary | ICD-10-CM

## 2017-05-31 DIAGNOSIS — C20 Malignant neoplasm of rectum: Secondary | ICD-10-CM

## 2017-05-31 DIAGNOSIS — C61 Malignant neoplasm of prostate: Secondary | ICD-10-CM | POA: Diagnosis not present

## 2017-05-31 DIAGNOSIS — Z510 Encounter for antineoplastic radiation therapy: Secondary | ICD-10-CM | POA: Diagnosis not present

## 2017-05-31 NOTE — Assessment & Plan Note (Signed)
His history of prostate cancer dates back to mid 90s where he was treated with brachytherapy as definitive approach.  The patient has prostate cancer with biochemical recurrence in 2015. Patient had a PSA of 14.93 during his hospitalization. He remains on Casodex. There is no evidence of metastatic prostate cancer on his most recent CT scan. Continue Casodex.

## 2017-05-31 NOTE — Progress Notes (Signed)
[facility] Initial Visit:  Patient Care Team: [name], MD as PCP - General (Family Medicine)  CHIEF COMPLAINTS/PURPOSE OF CONSULTATION: newly diagnosed rectal adenocarcinoma   No history exists.    HISTORY OF PRESENTING ILLNESS: Luke Johnson 83 y.o. male is here for new patient consultation. The patient was recently discharged from the hospital. The patient is a poor historian and much of the history is taken from the chart. The patient is unsure why he is here in our office today. The patient has a PMH of prostate cancer s/p brachytherapy with biochemical recurrence in 2015, stage 3 CKD, glaucoma, and T2DM who presented to the ED after caregivers noticed bright red blood per rectum. He was admitted to the internal medicine teaching service due to bright red blood per rectum. The patient is unsure as to how long this has been going on. During his hospitalization, the patient underwent a colonoscopy which revealed a mass in the distal rectum that was concerning for malignancy. The mass was biopsied and surgical pathology revealed an invasive adenocarcinoma that was likely a new diagnosis. The patient had a CT of the chest, abdomen, and pelvis CT. These scans showed no evidence of definitive distant metastasis to the lungs, but several perirectal and presacral nodes concerning for metastasis. He was seen by radiation oncology who plans palliative radiation to help with rectal bleeding and to prevent possible obstruction of the mass. CEA that was drawn on 05/24/2017 was normal at 4.6. The patient also has a history of prostate cancer with biochemical recurrence in 2015. He remains on Casodex. Recent PSA during hospitalization was 14.93.  The patient feels fine today and has no specific complaints. See detailed review of systems below. The patient is here to discuss role of chemotherapy in treatment for his rectal adenocarcinoma.  Review of Systems  Constitutional: Negative  for appetite change, chills, diaphoresis, fatigue, fever and unexpected weight change.  HENT:  Negative.   Eyes:       Legally blind  Respiratory: Negative.   Cardiovascular: Negative.   Gastrointestinal: Negative.        Patient had a recent hospitalization for rectal bleeding. Patient denies rectal bleeding today. Incontinent of stool.  Endocrine: Negative.   Genitourinary: Positive for bladder incontinence. Negative for dysuria, frequency and hematuria.   Musculoskeletal: Negative.   Skin: Negative.   Neurological: Negative.   Hematological: Negative.   Psychiatric/Behavioral: Negative.     MEDICAL HISTORY: Past Medical History:  Diagnosis Date  . Anemia, unspecified   . 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 Carcinoma in situ of prostate   . Chronic kidney disease, stage III (moderate)   . Diabetes mellitus   . Dyslipidemia   . Edema   . Legal blindness, as defined in [place]   . Obesity, unspecified   . Other and unspecified hyperlipidemia   . Type II or unspecified type diabetes mellitus with renal manifestations, not stated as uncontrolled(250.40)     SURGICAL HISTORY: Past Surgical History:  Procedure Laterality Date  . COLONOSCOPY  multiple  . COLONOSCOPY N/A 05/23/2017   Procedure: COLONOSCOPY;  Surgeon: Gatha Mayer, MD;  Location: [facility] ENDOSCOPY;  Service: Endoscopy;  Laterality: N/A;  . CYSTOSCOPY/RETROGRADE/URETEROSCOPY/STONE EXTRACTION WITH BASKET Left 08/04/2014   Procedure: CYSTOSCOPY/RETROGRADE/URETEROSCOPY/STONE EXTRACTION WITH BASKET;  Surgeon: Raynelle Bring, MD;  Location: [facility] ORS;  Service: Urology;  Laterality: Left;  SOCIAL HISTORY: Social History   Social History  . Marital status: Single    Spouse name: N/A  . Number of children: N/A  . Years of education: N/A   Occupational History  . Not on file.   Social History Main Topics  .  Smoking status: Former Games developer  . Smokeless tobacco: Never Used  . Alcohol use No  . Drug use: No  . Sexual activity: No   Other Topics Concern  . Not on file   Social History Narrative  . No narrative on file    FAMILY HISTORY Family History  Problem Relation Age of Onset  . Diabetes Mother     ALLERGIES:  has No Known Allergies.  MEDICATIONS:  Current Outpatient Prescriptions  Medication Sig Dispense Refill  . aspirin EC 81 MG tablet Take 81 mg by mouth every other day.     Marland Kitchen atorvastatin (LIPITOR) 20 MG tablet Take 20 mg by mouth at bedtime.    . bicalutamide (CASODEX) 50 MG tablet Take 50 mg by mouth daily.    . Cholecalciferol (D3-1000) 1000 units capsule Take 1,000 Units by mouth daily.    . cyanocobalamin 500 MCG tablet Take 500 mcg by mouth daily.    . feeding supplement, ENSURE COMPLETE, (ENSURE COMPLETE) LIQD Take 237 mLs by mouth daily. (Patient not taking: Reported on 05/23/2017) 30 Bottle 0  . ferrous fumarate (FERRO-SEQUELS) 50 MG CR tablet Take 50 mg by mouth daily after breakfast.     . HUMULIN 70/30 (70-30) 100 UNIT/ML injection Inject 10 Units into the skin daily after breakfast.     . mirtazapine (REMERON) 15 MG tablet Take 1 tablet (15 mg total) by mouth at bedtime. (Patient taking differently: Take 15 mg by mouth daily. ) 30 tablet 0  . Multiple Vitamin (MULTIVITAMIN WITH MINERALS) TABS Take 1 tablet by mouth daily.    . sennosides-docusate sodium (SENOKOT-S) 8.6-50 MG tablet Take 1 tablet by mouth daily.    . Skin Protectants, Misc. (EUCERIN) cream Apply 1 application topically 2 (two) times daily. Apply to affected areas as directed.     No current facility-administered medications for this visit.     PHYSICAL EXAMINATION:  ECOG PERFORMANCE STATUS: 3 - Symptomatic, >50% confined to bed   Vitals:   05/31/17 0942  BP: (!) 147/52  Pulse: 76  Resp: 18  Temp: 97.8 F (36.6 C)  SpO2: 100%    Filed Weights   05/31/17 0942  Weight: 169 lb 14.4 oz  (77.1 kg)     Physical Exam  Constitutional:  Elderly gentleman who is in no acute distress. He is in a wheelchair today.  HENT:  Head: Normocephalic and atraumatic.  Nose: Nose normal.  Mouth/Throat: Oropharynx is clear and moist. No oropharyngeal exudate.  Eyes: Right eye exhibits no discharge. Left eye exhibits no discharge. No scleral icterus.  Patient able to see light from ophthalmoscope. He was unable to see my hand and we could not perform evaluation of extraocular movements.  Neck: Normal range of motion. Neck supple. No JVD present. No tracheal deviation present.  Cardiovascular: Normal rate, regular rhythm, normal heart sounds and intact distal pulses.   Pulmonary/Chest: Effort normal and breath sounds normal. No respiratory distress. He has no wheezes. He has no rales.  Abdominal: Soft. Bowel sounds are normal. He exhibits no distension. There is no tenderness.  Patient was incontinent of stool during our visit. Rectal exam was performed by Dr. Truett Perna.  Genitourinary:  Genitourinary Comments: Patient was incontinent  of urine during our visit.  Musculoskeletal: Normal range of motion.  Trace pedal edema bilaterally.  Lymphadenopathy:    He has no cervical adenopathy.  Neurological: He is alert. He exhibits normal muscle tone. Gait normal.  Oriented to person and place only.  Skin: Skin is warm and dry. No rash noted. No erythema. No pallor.  Psychiatric: Mood and affect normal.  Patient is a poor historian.  Vitals reviewed.  Rectal exam: Stool coating the perineum. There is distal rectal mass anteriorly beginning within a few centimeters of the anal verge and extending several centimeters proximally. No cervical, supraclavicular, or inguinal nodes. No hepatomegaly.  LABORATORY DATA: I have personally reviewed the data as listed:  Admission on 05/22/2017, Discharged on 05/28/2017  Component Date Value Ref Range Status  . Sodium 05/22/2017 138  135 - 145 mmol/L  Final  . Potassium 05/22/2017 3.8  3.5 - 5.1 mmol/L Final  . Chloride 05/22/2017 107  101 - 111 mmol/L Final  . CO2 05/22/2017 23  22 - 32 mmol/L Final  . Glucose, Bld 05/22/2017 60* 65 - 99 mg/dL Final  . BUN 05/22/2017 31* 6 - 20 mg/dL Final  . Creatinine, Ser 05/22/2017 1.99* 0.61 - 1.24 mg/dL Final  . Calcium 05/22/2017 8.8* 8.9 - 10.3 mg/dL Final  . Total Protein 05/22/2017 6.7  6.5 - 8.1 g/dL Final  . Albumin 05/22/2017 3.4* 3.5 - 5.0 g/dL Final  . AST 05/22/2017 25  15 - 41 U/L Final  . ALT 05/22/2017 14* 17 - 63 U/L Final  . Alkaline Phosphatase 05/22/2017 93  38 - 126 U/L Final  . Total Bilirubin 05/22/2017 0.7  0.3 - 1.2 mg/dL Final  . GFR calc non Af Amer 05/22/2017 28* >60 mL/min Final  . GFR calc Af Amer 05/22/2017 33* >60 mL/min Final   Comment: (NOTE) The eGFR has been calculated using the CKD EPI equation. This calculation has not been validated in all clinical situations. eGFR's persistently <60 mL/min signify possible Chronic Kidney Disease.   . Anion gap 05/22/2017 8  5 - 15 Final  . WBC 05/22/2017 4.4  4.0 - 10.5 K/uL Final  . RBC 05/22/2017 4.17* 4.22 - 5.81 MIL/uL Final  . Hemoglobin 05/22/2017 10.7* 13.0 - 17.0 g/dL Final  . HCT 05/22/2017 33.9* 39.0 - 52.0 % Final  . MCV 05/22/2017 81.3  78.0 - 100.0 fL Final  . MCH 05/22/2017 25.7* 26.0 - 34.0 pg Final  . MCHC 05/22/2017 31.6  30.0 - 36.0 g/dL Final  . RDW 05/22/2017 14.4  11.5 - 15.5 % Final  . Platelets 05/22/2017 171  150 - 400 K/uL Final  . ABO/RH(D) 05/22/2017 B POS   Final  . Antibody Screen 05/22/2017 NEG   Final  . Sample Expiration 05/22/2017 05/25/2017   Final  . Fecal Occult Bld 05/22/2017 POSITIVE* NEGATIVE Final  . aPTT 05/22/2017 26  24 - 36 seconds Final  . Prothrombin Time 05/22/2017 13.9  11.4 - 15.2 seconds Final  . INR 05/22/2017 1.07   Final  . ABO/RH(D) 05/22/2017 B POS   Final  . Glucose-Capillary 05/22/2017 108* 65 - 99 mg/dL Final  . Comment 1 05/22/2017 Notify RN   Final  .  Comment 2 05/22/2017 Document in Chart   Final  . Hgb A1c MFr Bld 05/22/2017 6.2* 4.8 - 5.6 % Final   Comment: (NOTE) Pre diabetes:          5.7%-6.4% Diabetes:              >  6.4% Glycemic control for   <7.0% adults with diabetes   . Mean Plasma Glucose 05/22/2017 131.24  mg/dL Final  . Sodium 30/09/3798 139  135 - 145 mmol/L Final  . Potassium 05/23/2017 4.6  3.5 - 5.1 mmol/L Final   DELTA CHECK NOTED  . Chloride 05/23/2017 107  101 - 111 mmol/L Final  . CO2 05/23/2017 21* 22 - 32 mmol/L Final  . Glucose, Bld 05/23/2017 130* 65 - 99 mg/dL Final  . BUN 09/40/0050 30* 6 - 20 mg/dL Final  . Creatinine, Ser 05/23/2017 1.99* 0.61 - 1.24 mg/dL Final  . Calcium 56/78/8933 8.4* 8.9 - 10.3 mg/dL Final  . GFR calc non Af Amer 05/23/2017 28* >60 mL/min Final  . GFR calc Af Amer 05/23/2017 33* >60 mL/min Final   Comment: (NOTE) The eGFR has been calculated using the CKD EPI equation. This calculation has not been validated in all clinical situations. eGFR's persistently <60 mL/min signify possible Chronic Kidney Disease.   . Anion gap 05/23/2017 11  5 - 15 Final  . WBC 05/23/2017 5.2  4.0 - 10.5 K/uL Final  . RBC 05/23/2017 4.07* 4.22 - 5.81 MIL/uL Final  . Hemoglobin 05/23/2017 10.5* 13.0 - 17.0 g/dL Final  . HCT 88/26/6664 32.9* 39.0 - 52.0 % Final  . MCV 05/23/2017 80.8  78.0 - 100.0 fL Final  . MCH 05/23/2017 25.8* 26.0 - 34.0 pg Final  . MCHC 05/23/2017 31.9  30.0 - 36.0 g/dL Final  . RDW 86/16/1224 14.1  11.5 - 15.5 % Final  . Platelets 05/23/2017 187  150 - 400 K/uL Final  . MRSA by PCR 05/23/2017 NEGATIVE  NEGATIVE Final   Comment:        The GeneXpert MRSA Assay (FDA approved for NASAL specimens only), is one component of a comprehensive MRSA colonization surveillance program. It is not intended to diagnose MRSA infection nor to guide or monitor treatment for MRSA infections.   . Glucose-Capillary 05/23/2017 160* 65 - 99 mg/dL Final  . Glucose-Capillary 05/23/2017 117*  65 - 99 mg/dL Final  . Glucose-Capillary 05/23/2017 142* 65 - 99 mg/dL Final  . Comment 1 00/18/0970 Notify RN   Final  . CEA 05/24/2017 4.6  0.0 - 4.7 ng/mL Final   Comment: (NOTE)       Roche ECLIA methodology       Nonsmokers  <3.9                                     Smokers     <5.6 Performed At: [facility] [address] [place], [place] [id number] Mila Homer MD [id number]   . Glucose-Capillary 05/23/2017 147* 65 - 99 mg/dL Final  . Glucose-Capillary 05/24/2017 148* 65 - 99 mg/dL Final  . Glucose-Capillary 05/24/2017 116* 65 - 99 mg/dL Final  . Glucose-Capillary 05/25/2017 134* 65 - 99 mg/dL Final  . Glucose-Capillary 05/25/2017 177* 65 - 99 mg/dL Final  . Glucose-Capillary 05/25/2017 134* 65 - 99 mg/dL Final  . Glucose-Capillary 05/26/2017 113* 65 - 99 mg/dL Final  . Glucose-Capillary 05/26/2017 105* 65 - 99 mg/dL Final  . Glucose-Capillary 05/26/2017 158* 65 - 99 mg/dL Final  . Glucose-Capillary 05/26/2017 119* 65 - 99 mg/dL Final  . Prostatic Specific Antigen 05/26/2017 14.93* 0.00 - 4.00 ng/mL Final   Comment: (NOTE) While PSA levels of <=4.0 ng/ml are reported as reference range, some men with levels below 4.0 ng/ml can  have prostate cancer and many men with PSA above 4.0 ng/ml do not have prostate cancer.  Other tests such as free PSA, age specific reference ranges, PSA velocity and PSA doubling time may be helpful especially in men less than 85 years old.   . Glucose-Capillary 05/26/2017 129* 65 - 99 mg/dL Final  . Glucose-Capillary 05/27/2017 114* 65 - 99 mg/dL Final  . Testosterone 05/27/2017 306  264 - 916 ng/dL Final   Comment: (NOTE) Adult male reference interval is based on a population of healthy nonobese males (BMI <30) between 79 and 64 years old. [place], [place] [phone]. PMID: [id number].   Marland Kitchen Testosterone, Free 05/27/2017 2.9* 6.6 - 18.1 pg/mL Final   Comment: (NOTE) Performed At: [facility] [place], [place] [id number] Lindon Romp MD [id number]   . Glucose-Capillary 05/27/2017 136* 65 - 99 mg/dL Final  . Comment 1 05/27/2017 Notify RN   Final  . Glucose-Capillary 05/27/2017 163* 65 - 99 mg/dL Final  . Comment 1 05/27/2017 Notify RN   Final  . Glucose-Capillary 05/27/2017 123* 65 - 99 mg/dL Final  . Comment 1 05/27/2017 Notify RN   Final  . Comment 2 05/27/2017 Document in Chart   Final  . Glucose-Capillary 05/28/2017 102* 65 - 99 mg/dL Final  . Comment 1 05/28/2017 Notify RN   Final  . Glucose-Capillary 05/28/2017 119* 65 - 99 mg/dL Final  . Comment 1 05/28/2017 Notify RN   Final    RADIOGRAPHIC STUDIES: EXAM: CT CHEST, ABDOMEN AND PELVIS WITHOUT CONTRAST  TECHNIQUE: Multidetector CT imaging of the chest, abdomen and pelvis was performed following the standard protocol without IV contrast.  COMPARISON:  Abdomen and pelvis CT 08/03/2014.  FINDINGS: CT CHEST FINDINGS  Cardiovascular: The heart size is normal. No pericardial effusion. Coronary artery calcification is noted. Atherosclerotic calcification is noted in the wall of the thoracic aorta.  Mediastinum/Nodes: No mediastinal lymphadenopathy. No evidence for gross hilar lymphadenopathy although assessment is limited by the lack of intravenous contrast on today's study. Large hiatal hernia noted, containing nearly the entire stomach. There is no axillary lymphadenopathy.  Lungs/Pleura: Small focus of architectural distortion identified right apex on image 21 series for. 3 mm right upper lobe nodule identified on image 57. 3 mm right middle lobe nodule seen posteriorly on image 70. 4 mm right lower lobe nodule is seen on image 88. Compressive atelectasis identified left lower lobe around the hiatal hernia. Atelectasis or scarring noted in the inferior lingula.  Musculoskeletal: Bone windows reveal no worrisome lytic or sclerotic osseous lesions. Bilateral gynecomastia evident.  CT ABDOMEN  PELVIS FINDINGS  Hepatobiliary: 8 mm low-density lesion in the extreme hepatic dome, adjacent to the IVC was not well seen on the prior study but was present on an exam from 09/28/2010 and is probably a cyst. 5 mm low-density lesion in the inferior right liver was present on the prior study. Otherwise no focal abnormality is evident in the liver on this noncontrast exam. Gallbladder is decompressed. No intrahepatic or extrahepatic biliary dilation.  Pancreas: No focal mass lesion. No dilatation of the main duct. No intraparenchymal cyst. No peripancreatic edema.  Spleen: No splenomegaly. No focal mass lesion.  Adrenals/Urinary Tract: No adrenal nodule or mass. Kidneys are unremarkable. No evidence for hydroureter. Anterior bladder wall is distorted, tracking just into a right groin hernia.  Stomach/Bowel: Nearly the entire stomach is contained within a hiatal hernia. Duodenum is normally positioned as is the ligament of Treitz. No small bowel wall  thickening. No small bowel dilatation. The terminal ileum is normal. The appendix is normal. No gross colonic mass. No colonic wall thickening. No substantial diverticular change. Mild wall irregularity is seen along the anterior aspect of the distal rectum in this patient with known rectal carcinoma.  Vascular/Lymphatic: There is abdominal aortic atherosclerosis without aneurysm. There is no gastrohepatic or hepatoduodenal ligament lymphadenopathy. No intraperitoneal or retroperitoneal lymphadenopathy. No pelvic sidewall lymphadenopathy. Small 6 mm short axis perirectal lymph node is identified in presacral space (image 105 of series 3). Additional 7 and 5 mm short axis lymph nodes are seen more cranially in the presacral space (image 97 series 3).  Reproductive: Brachytherapy seeds are noted in the prostate gland with probable central TURP defect.  Other: No intraperitoneal free fluid.  Musculoskeletal: Osseous  mineralization is heterogeneous, presumably related to osteopenia. Interval development of multiple thoracolumbar Schmorl's nodes evident. Prominent heterogeneous mineralization of the right iliac bone is stable comparing to 08/03/2014. Right groin hernia contains fat and a knuckle of the anterior bladder wall.  IMPRESSION: 1. Wall irregularity along the anterior rectum presumably represents the patient's known primary malignancy. 2. Small perirectal and presacral lymph nodes concerning for metastatic disease. 3. Several tiny right pulmonary nodules, too small to characterize. Metastatic disease not excluded and close attention on follow-up recommended. Small focus of architectural distortion in the right upper lobe could also be reassessed at the time of followup imaging. 4. Tiny low-density liver lesions seen on prior imaging studies and likely indicative of cysts. No definite metastatic disease to the liver although lack of intravenous contrast lessens sensitivity for detection. 5. Large hiatal hernia contains nearly the entire stomach. 6. Right groin hernia contains a knuckle of the anterior bladder wall. 7. Heterogeneous bony mineralization without definite metastatic disease in the osseous anatomy. 8.  Aortic Atherosclerois (ICD10-170.0)   Electronically Signed   By: Misty Stanley M.D.   On: 05/24/2017 16:21  PATHOLOGY: Diagnosis Rectum, biopsy - INVASIVE ADENOCARCINOMA. Microscopic Comment Dr. Lyndon Code has reviewed the case. Dr. Carlean Purl was paged on 05/24/2017. Vicente Males MD Pathologist, Electronic Signature (Case signed 05/24/2017) Specimen Gross and Clinical Information Specimen(s) Obtained: Rectum, biopsy Specimen Clinical Information Rectal bleeding, rectal mass (nt) Gross Received in formalin are tan, soft tissue fragments that are submitted in toto. 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 The patient was seen and examined with Dr. Benay Spice. Diagnosis was discussed with the patient who has limited insight into this conversation. Treatment options were discussed with the patient including adding chemotherapy to radiation.  Due to poor performance status, recommend proceeding with palliative radiation alone to help with rectal bleeding and to prevent possible obstruction.  If the patient develops metastatic disease, we can see the patient back and discuss consideration of hospice.  Prostate cancer [facility])  His history of prostate cancer dates back to mid 90s where he was treated with brachytherapy as definitive approach.  The patient has prostate cancer with biochemical recurrence in 2015. Patient had a PSA of 14.93 during his hospitalization. He remains on Casodex. There is no evidence of metastatic prostate cancer on his most recent CT scan. Continue Casodex.   No orders of the defined types were placed in this encounter.   All questions were  answered. The patient knows to call the clinic with any problems, questions or concerns.    Mikey Bussing, NP  05/31/2017 1:59 PM   This was a shared visit with Mikey Bussing. Luke Johnson was interviewed and examined. I reviewed the CT images from 05/24/2017. He has been diagnosed with rectal cancer. He appears to have locally advanced disease, though the small lung nodules could represent metastatic disease. Luke Johnson appears to have a poor understanding of the current situation. He has multiple comorbid conditions including biochemical recurrence of prostate cancer. He does not appear to be a candidate for systemic therapy.  I discussed the case with Dr. Tammi Klippel. The plan is to complete  palliative radiation to the rectum with the hope of preventing a rectal obstruction and decreasing rectal bleeding.  If he develops progressive metastatic disease in the future I recommend Hospice care.  Luke Johnson is not scheduled for a follow-up appointment in medical oncology. I will be glad to see him in the future as needed. Julieanne Manson, M.D.

## 2017-05-31 NOTE — Assessment & Plan Note (Signed)
This is an 83 year old gentleman with newly diagnosed rectal adenocarcinoma. The patient was seen and examined with Dr. Benay Spice. Diagnosis was discussed with the patient who has limited insight into this conversation. Treatment options were discussed with the patient including adding chemotherapy to radiation.  Due to poor performance status, recommend proceeding with palliative radiation alone to help with rectal bleeding and to prevent possible obstruction.  If the patient develops metastatic disease, we can see the patient back and discuss consideration of hospice.

## 2017-05-31 NOTE — Telephone Encounter (Signed)
No los per 8/17. °

## 2017-06-03 ENCOUNTER — Ambulatory Visit
Admission: RE | Admit: 2017-06-03 | Discharge: 2017-06-03 | Disposition: A | Discharge status: Home / Self Care | Payer: Medicare Other | Source: Ambulatory Visit | Attending: Radiation Oncology | Admitting: Radiation Oncology

## 2017-06-03 DIAGNOSIS — Z510 Encounter for antineoplastic radiation therapy: Secondary | ICD-10-CM | POA: Diagnosis not present

## 2017-06-04 ENCOUNTER — Ambulatory Visit
Admission: RE | Admit: 2017-06-04 | Discharge: 2017-06-04 | Disposition: A | Discharge status: Home / Self Care | Payer: Medicare Other | Source: Ambulatory Visit | Attending: Radiation Oncology | Admitting: Radiation Oncology

## 2017-06-04 DIAGNOSIS — Z510 Encounter for antineoplastic radiation therapy: Secondary | ICD-10-CM | POA: Diagnosis not present

## 2017-06-05 ENCOUNTER — Ambulatory Visit: Payer: Medicare Other | Admitting: Radiation Oncology

## 2017-06-05 ENCOUNTER — Ambulatory Visit: Payer: Medicare Other

## 2017-06-05 ENCOUNTER — Ambulatory Visit
Admission: RE | Admit: 2017-06-05 | Discharge: 2017-06-05 | Disposition: A | Discharge status: Home / Self Care | Payer: Medicare Other | Source: Ambulatory Visit | Attending: Radiation Oncology | Admitting: Radiation Oncology

## 2017-06-05 DIAGNOSIS — Z510 Encounter for antineoplastic radiation therapy: Secondary | ICD-10-CM | POA: Diagnosis not present

## 2017-06-06 ENCOUNTER — Ambulatory Visit
Admission: RE | Admit: 2017-06-06 | Discharge: 2017-06-06 | Disposition: A | Discharge status: Home / Self Care | Payer: Medicare Other | Source: Ambulatory Visit | Attending: Radiation Oncology | Admitting: Radiation Oncology

## 2017-06-06 DIAGNOSIS — Z510 Encounter for antineoplastic radiation therapy: Secondary | ICD-10-CM | POA: Diagnosis not present

## 2017-06-07 ENCOUNTER — Ambulatory Visit
Admission: RE | Admit: 2017-06-07 | Discharge: 2017-06-07 | Disposition: A | Discharge status: Home / Self Care | Payer: Medicare Other | Source: Ambulatory Visit | Attending: Radiation Oncology | Admitting: Radiation Oncology

## 2017-06-07 DIAGNOSIS — Z510 Encounter for antineoplastic radiation therapy: Secondary | ICD-10-CM | POA: Diagnosis not present

## 2017-06-10 ENCOUNTER — Ambulatory Visit
Admission: RE | Admit: 2017-06-10 | Discharge: 2017-06-10 | Disposition: A | Discharge status: Home / Self Care | Payer: Medicare Other | Source: Ambulatory Visit | Attending: Radiation Oncology | Admitting: Radiation Oncology

## 2017-06-10 DIAGNOSIS — Z510 Encounter for antineoplastic radiation therapy: Secondary | ICD-10-CM | POA: Diagnosis not present

## 2017-06-11 ENCOUNTER — Ambulatory Visit
Admission: RE | Admit: 2017-06-11 | Discharge: 2017-06-11 | Disposition: A | Discharge status: Home / Self Care | Payer: Medicare Other | Source: Ambulatory Visit | Attending: Radiation Oncology | Admitting: Radiation Oncology

## 2017-06-11 DIAGNOSIS — Z510 Encounter for antineoplastic radiation therapy: Secondary | ICD-10-CM | POA: Diagnosis not present

## 2017-06-12 ENCOUNTER — Ambulatory Visit
Admission: RE | Admit: 2017-06-12 | Discharge: 2017-06-12 | Disposition: A | Discharge status: Home / Self Care | Payer: Medicare Other | Source: Ambulatory Visit | Attending: Radiation Oncology | Admitting: Radiation Oncology

## 2017-06-12 DIAGNOSIS — Z510 Encounter for antineoplastic radiation therapy: Secondary | ICD-10-CM | POA: Diagnosis not present

## 2017-06-13 ENCOUNTER — Ambulatory Visit
Admission: RE | Admit: 2017-06-13 | Discharge: 2017-06-13 | Disposition: A | Discharge status: Home / Self Care | Payer: Medicare Other | Source: Ambulatory Visit | Attending: Radiation Oncology | Admitting: Radiation Oncology

## 2017-06-13 DIAGNOSIS — Z510 Encounter for antineoplastic radiation therapy: Secondary | ICD-10-CM | POA: Diagnosis not present

## 2017-06-14 ENCOUNTER — Encounter: Payer: Self-pay | Admitting: Radiation Oncology

## 2017-06-14 ENCOUNTER — Ambulatory Visit
Admission: RE | Admit: 2017-06-14 | Discharge: 2017-06-14 | Disposition: A | Discharge status: Home / Self Care | Payer: Medicare Other | Source: Ambulatory Visit | Attending: Radiation Oncology | Admitting: Radiation Oncology

## 2017-06-14 DIAGNOSIS — Z510 Encounter for antineoplastic radiation therapy: Secondary | ICD-10-CM | POA: Diagnosis not present

## 2017-06-17 NOTE — Progress Notes (Signed)
  Radiation Oncology         [phone] ________________________________  Name: Luke Johnson MRN: [id number]  Date: 06/14/2017  DOB: 07/17/1926  End of Treatment Note  Diagnosis:   83 yo man with rectal bleeding from adenocarcinoma of the rectum with previous prostate seed implant.     Indication for treatment:  Palliation       Radiation treatment dates:   06/03/17-06/14/17  Site/dose:   The rectal mass was treated to 30 Gy in 10 fractions of 3 Gy  Beams/energy:   A 5-field 3D beam arrangement was used with equally spaced gantry angles circumferentially oriented around the patient with 15 MV X-rays.  Narrative: The patient tolerated radiation treatment relatively well.   His bleeding stopped and he had no acute effects.  Plan: The patient has completed radiation treatment. The patient will return to radiation oncology clinic for routine followup in one month. I advised him to call or return sooner if he has any questions or concerns related to his recovery or treatment. ________________________________  Sheral Apley. Tammi Klippel, M.D.

## 2017-07-16 ENCOUNTER — Ambulatory Visit
Admission: RE | Admit: 2017-07-16 | Discharge: 2017-07-16 | Disposition: A | Discharge status: Home / Self Care | Payer: Medicare Other | Source: Ambulatory Visit | Attending: Urology | Admitting: Urology

## 2017-07-16 DIAGNOSIS — C20 Malignant neoplasm of rectum: Secondary | ICD-10-CM

## 2017-07-16 DIAGNOSIS — C775 Secondary and unspecified malignant neoplasm of intrapelvic lymph nodes: Principal | ICD-10-CM

## 2017-07-16 DIAGNOSIS — C61 Malignant neoplasm of prostate: Secondary | ICD-10-CM

## 2017-12-13 DEATH — deceased

## 2018-12-13 IMAGING — CT CT CHEST W/O CM
2 of 4 series · 11 of 36 positions shown, 13 images · non-contrast
Comparison: Abdomen and pelvis CT 08/03/2014.

CLINICAL DATA: Rectal cancer staging.

EXAM:
CT CHEST, ABDOMEN AND PELVIS WITHOUT CONTRAST
TECHNIQUE: Multidetector CT imaging of the chest, abdomen and pelvis was
performed following the standard protocol without IV contrast.

[Series 3: cap wo 5.0 i31f 2 · axial · 0.64mm/px · z∈[+979,+1484]mm · 8 of 121 slices shown, 10 images]
[im 10/121  mediastinal]
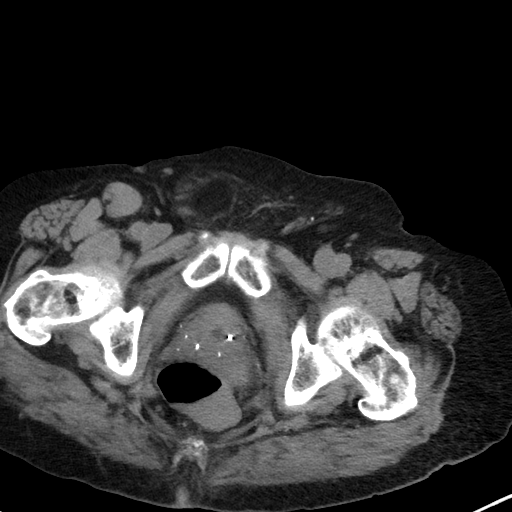
[im 10/121  lung]
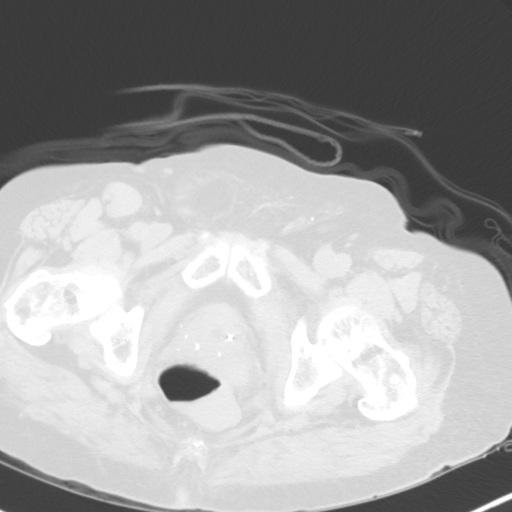
[im 28/121  lung]
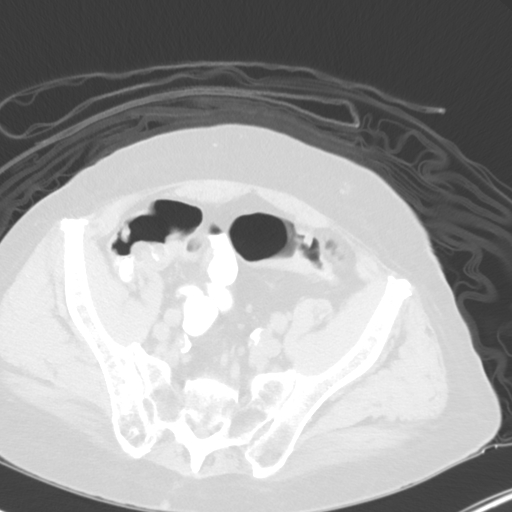
[im 37/121  lung]
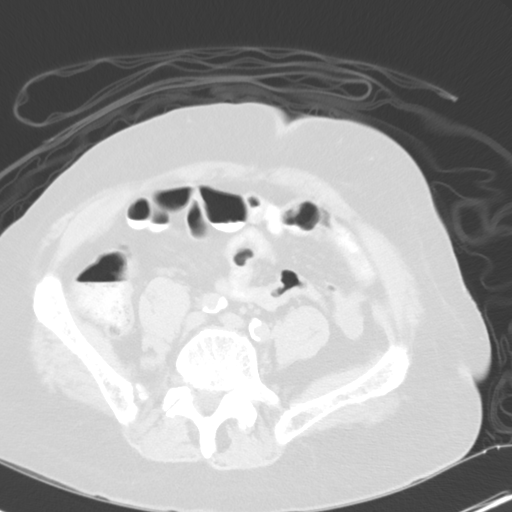
[im 56/121  lung]
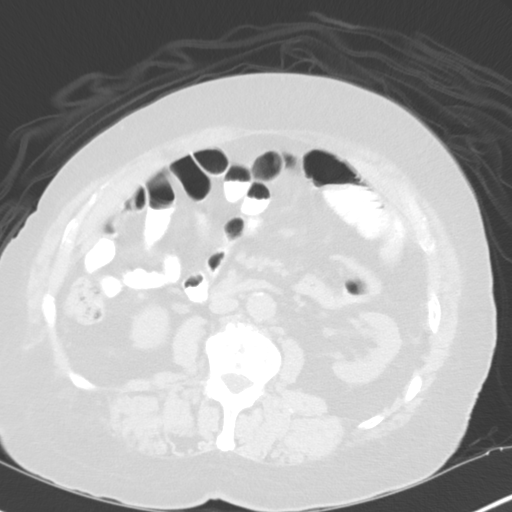
[im 65/121  mediastinal]
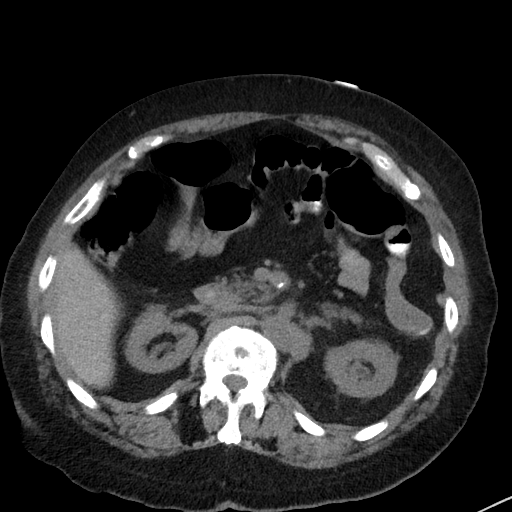
[im 65/121  lung]
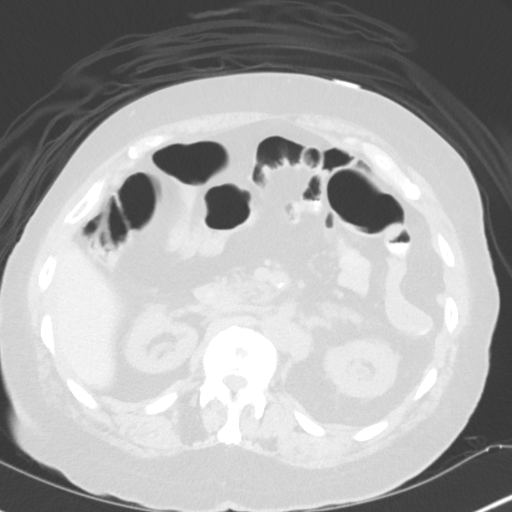
[im 84/121  lung]
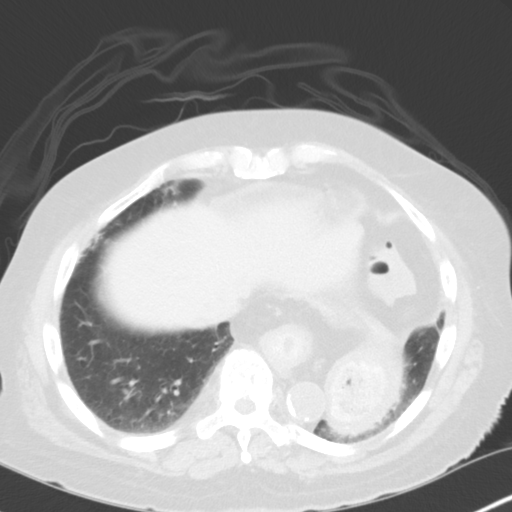
[im 93/121  lung]
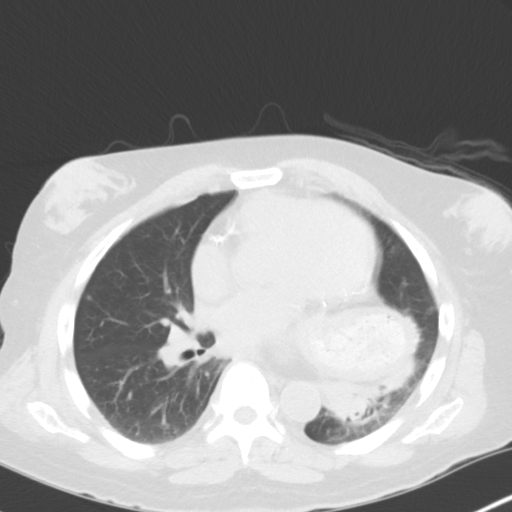
[im 111/121  lung]
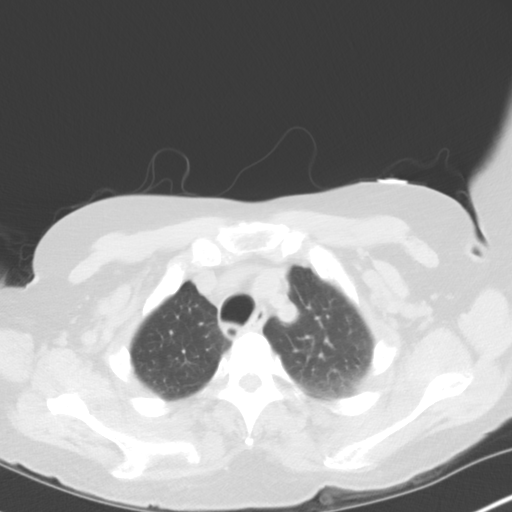

[Series 6: coronal · coronal · 0.59mm/px · 3 of 141 slices shown]
[im 29/141  lung]
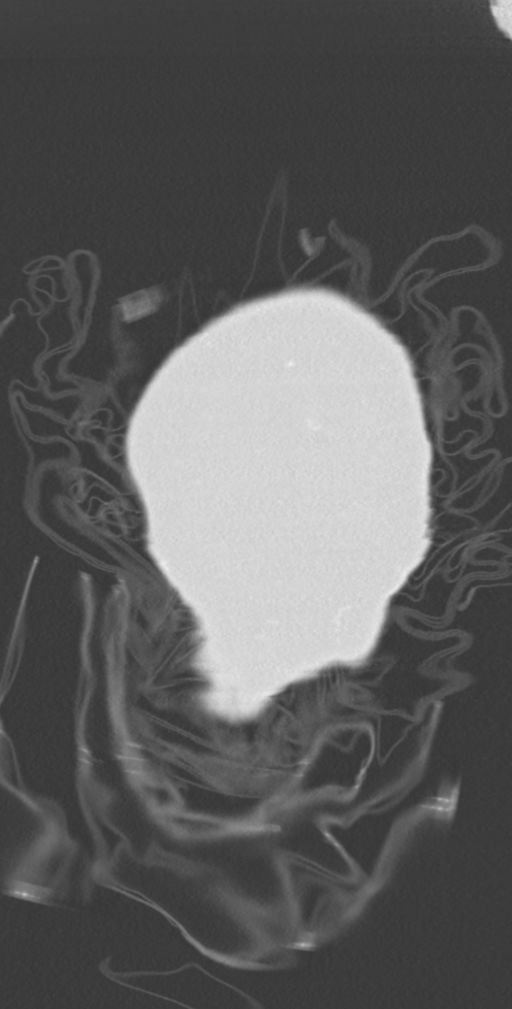
[im 57/141  lung]
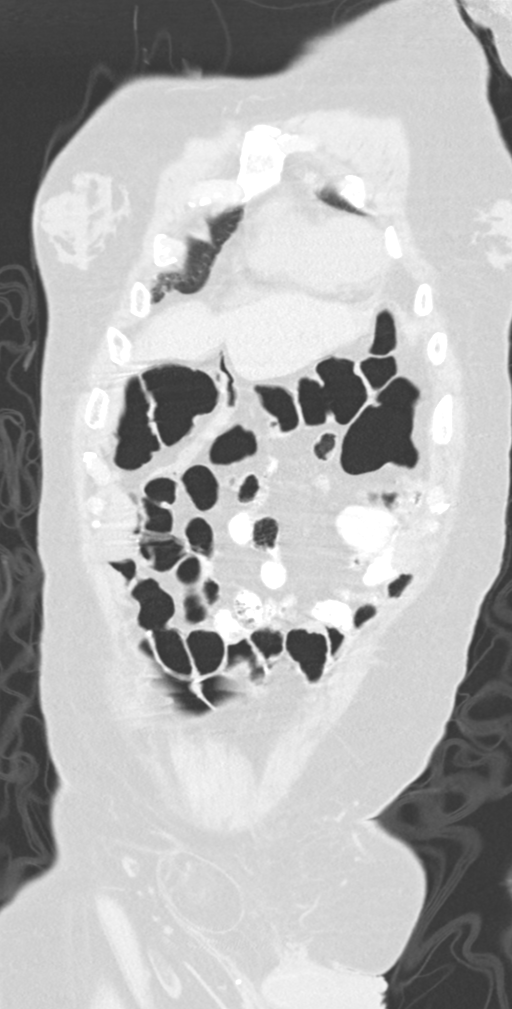
[im 85/141  lung]
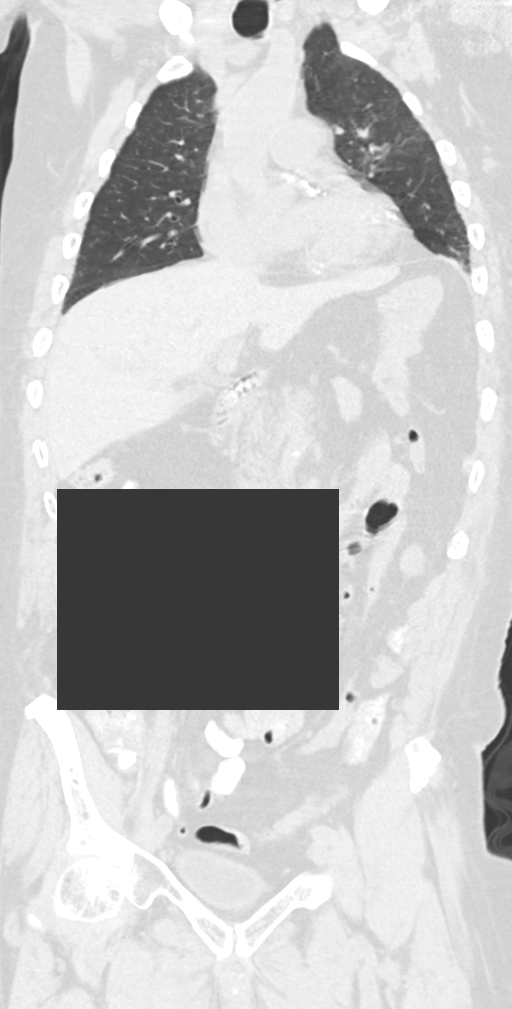

[11 of 36 positions shown; findings below may reference images not displayed]

FINDINGS: CT CHEST FINDINGS

Cardiovascular: The heart size is normal. No pericardial effusion.
Coronary artery calcification is noted. Atherosclerotic
calcification is noted in the wall of the thoracic aorta.

Mediastinum/Nodes: No mediastinal lymphadenopathy. No evidence for
gross hilar lymphadenopathy although assessment is limited by the
lack of intravenous contrast on today's study. Large hiatal hernia
noted, containing nearly the entire stomach. There is no axillary
lymphadenopathy.

Lungs/Pleura: Small focus of architectural distortion identified
right apex on image 21 series for. 3 mm right upper lobe nodule
identified on image 57. 3 mm right middle lobe nodule seen
posteriorly on image 70. 4 mm right lower lobe nodule is seen on
image 88. Compressive atelectasis identified left lower lobe around
the hiatal hernia. Atelectasis or scarring noted in the inferior
lingula.

Musculoskeletal: Bone windows reveal no worrisome lytic or sclerotic
osseous lesions. Bilateral gynecomastia evident.

CT ABDOMEN PELVIS FINDINGS

Hepatobiliary: 8 mm low-density lesion in the extreme hepatic dome,
adjacent to the IVC was not well seen on the prior study but was
present on an exam from 09/28/2010 and is probably a cyst. 5 mm
low-density lesion in the inferior right liver was present on the
prior study. Otherwise no focal abnormality is evident in the liver
on this noncontrast exam. Gallbladder is decompressed. No
intrahepatic or extrahepatic biliary dilation.

Pancreas: No focal mass lesion. No dilatation of the main duct. No
intraparenchymal cyst. No peripancreatic edema.

Spleen: No splenomegaly. No focal mass lesion.

Adrenals/Urinary Tract: No adrenal nodule or mass. Kidneys are
unremarkable. No evidence for hydroureter. Anterior bladder wall is
distorted, tracking just into a right groin hernia.

Stomach/Bowel: Nearly the entire stomach is contained within a
hiatal hernia. Duodenum is normally positioned as is the ligament of
Treitz. No small bowel wall thickening. No small bowel dilatation.
The terminal ileum is normal. The appendix is normal. No gross
colonic mass. No colonic wall thickening. No substantial
diverticular change. Mild wall irregularity is seen along the
anterior aspect of the distal rectum in this patient with known
rectal carcinoma.

Vascular/Lymphatic: There is abdominal aortic atherosclerosis
without aneurysm. There is no gastrohepatic or hepatoduodenal
ligament lymphadenopathy. No intraperitoneal or retroperitoneal
lymphadenopathy. No pelvic sidewall lymphadenopathy. Small 6 mm
short axis perirectal lymph node is identified in presacral space
(image 105 of series 3). Additional 7 and 5 mm short axis lymph
nodes are seen more cranially in the presacral space (image 97
series 3).

Reproductive: Brachytherapy seeds are noted in the prostate gland
with probable central TURP defect.

Other: No intraperitoneal free fluid.

Musculoskeletal: Osseous mineralization is heterogeneous, presumably
related to osteopenia. Interval development of multiple
thoracolumbar Schmorl's nodes evident. Prominent heterogeneous
mineralization of the right iliac bone is stable comparing to
08/03/2014. Right groin hernia contains fat and a knuckle of the
anterior bladder wall.
IMPRESSION: 1. Wall irregularity along the anterior rectum presumably represents
the patient's known primary malignancy.
2. Small perirectal and presacral lymph nodes concerning for
metastatic disease.
3. Several tiny right pulmonary nodules, too small to characterize.
Metastatic disease not excluded and close attention on follow-up
recommended. Small focus of architectural distortion in the right
upper lobe could also be reassessed at the time of followup imaging.
4. Tiny low-density liver lesions seen on prior imaging studies and
likely indicative of cysts. No definite metastatic disease to the
liver although lack of intravenous contrast lessens sensitivity for
detection.
5. Large hiatal hernia contains nearly the entire stomach.
6. Right groin hernia contains a knuckle of the anterior bladder
wall.
7. Heterogeneous bony mineralization without definite metastatic
disease in the osseous anatomy.
8.  Aortic Atherosclerois ([id number]-170.0)
